# Patient Record
Sex: Female | Born: 1960 | Race: White | Hispanic: No | Marital: Married | Smoking: Former smoker
Health system: Southern US, Community
[De-identification: ages and names within clinical notes are randomized; demographics above are authoritative.]

## PROBLEM LIST (undated history)

## (undated) DIAGNOSIS — J302 Other seasonal allergic rhinitis: Secondary | ICD-10-CM

## (undated) DIAGNOSIS — E042 Nontoxic multinodular goiter: Secondary | ICD-10-CM

## (undated) DIAGNOSIS — I10 Essential (primary) hypertension: Secondary | ICD-10-CM

## (undated) DIAGNOSIS — E041 Nontoxic single thyroid nodule: Secondary | ICD-10-CM

## (undated) DIAGNOSIS — R011 Cardiac murmur, unspecified: Secondary | ICD-10-CM

## (undated) DIAGNOSIS — N390 Urinary tract infection, site not specified: Secondary | ICD-10-CM

## (undated) DIAGNOSIS — E559 Vitamin D deficiency, unspecified: Secondary | ICD-10-CM

## (undated) HISTORY — DX: Vitamin D deficiency, unspecified: E55.9

## (undated) HISTORY — PX: WISDOM TOOTH EXTRACTION: SHX21

## (undated) HISTORY — DX: Essential (primary) hypertension: I10

## (undated) HISTORY — DX: Nontoxic multinodular goiter: E04.2

## (undated) HISTORY — DX: Cardiac murmur, unspecified: R01.1

## (undated) HISTORY — PX: EYE SURGERY: SHX253

---

## 2003-12-28 ENCOUNTER — Other Ambulatory Visit: Admission: RE | Admit: 2003-12-28 | Discharge: 2003-12-28 | Payer: Self-pay | Admitting: Family Medicine

## 2005-10-02 ENCOUNTER — Other Ambulatory Visit: Admission: RE | Admit: 2005-10-02 | Discharge: 2005-10-02 | Payer: Self-pay | Admitting: Family Medicine

## 2007-07-22 ENCOUNTER — Other Ambulatory Visit: Admission: RE | Admit: 2007-07-22 | Discharge: 2007-07-22 | Payer: Self-pay | Admitting: Family Medicine

## 2009-04-26 ENCOUNTER — Other Ambulatory Visit: Admission: RE | Admit: 2009-04-26 | Discharge: 2009-04-26 | Payer: Self-pay | Admitting: Family Medicine

## 2010-09-19 ENCOUNTER — Other Ambulatory Visit
Admission: RE | Admit: 2010-09-19 | Discharge: 2010-09-19 | Payer: Self-pay | Source: Home / Self Care | Admitting: Family Medicine

## 2011-12-30 ENCOUNTER — Other Ambulatory Visit: Payer: Self-pay | Admitting: Family Medicine

## 2011-12-30 DIAGNOSIS — M542 Cervicalgia: Secondary | ICD-10-CM

## 2011-12-31 ENCOUNTER — Ambulatory Visit
Admission: RE | Admit: 2011-12-31 | Discharge: 2011-12-31 | Disposition: A | Discharge status: Home / Self Care | Payer: Managed Care, Other (non HMO) | Source: Ambulatory Visit | Attending: Family Medicine | Admitting: Family Medicine

## 2011-12-31 DIAGNOSIS — M542 Cervicalgia: Secondary | ICD-10-CM

## 2012-01-01 ENCOUNTER — Other Ambulatory Visit: Payer: Self-pay

## 2012-01-02 ENCOUNTER — Other Ambulatory Visit: Payer: Self-pay | Admitting: Family Medicine

## 2012-01-02 DIAGNOSIS — E041 Nontoxic single thyroid nodule: Secondary | ICD-10-CM

## 2012-01-03 ENCOUNTER — Ambulatory Visit
Admission: RE | Admit: 2012-01-03 | Discharge: 2012-01-03 | Disposition: A | Discharge status: Home / Self Care | Payer: Managed Care, Other (non HMO) | Source: Ambulatory Visit | Attending: Family Medicine | Admitting: Family Medicine

## 2012-01-03 DIAGNOSIS — E041 Nontoxic single thyroid nodule: Secondary | ICD-10-CM

## 2012-01-06 ENCOUNTER — Encounter (HOSPITAL_COMMUNITY): Payer: Self-pay | Admitting: Pharmacy Technician

## 2012-01-06 ENCOUNTER — Other Ambulatory Visit: Payer: Self-pay | Admitting: Neurological Surgery

## 2012-01-08 ENCOUNTER — Encounter (HOSPITAL_COMMUNITY): Payer: Self-pay

## 2012-01-08 ENCOUNTER — Ambulatory Visit (HOSPITAL_COMMUNITY)
Admission: RE | Admit: 2012-01-08 | Discharge: 2012-01-08 | Disposition: A | Discharge status: Home / Self Care | Payer: Managed Care, Other (non HMO) | Source: Ambulatory Visit | Attending: Neurological Surgery | Admitting: Neurological Surgery

## 2012-01-08 ENCOUNTER — Encounter (HOSPITAL_COMMUNITY)
Admission: RE | Admit: 2012-01-08 | Discharge: 2012-01-08 | Disposition: A | Discharge status: Home / Self Care | Payer: Managed Care, Other (non HMO) | Source: Ambulatory Visit | Attending: Neurological Surgery | Admitting: Neurological Surgery

## 2012-01-08 DIAGNOSIS — Z01818 Encounter for other preprocedural examination: Secondary | ICD-10-CM | POA: Insufficient documentation

## 2012-01-08 DIAGNOSIS — Z01810 Encounter for preprocedural cardiovascular examination: Secondary | ICD-10-CM | POA: Insufficient documentation

## 2012-01-08 DIAGNOSIS — Z01812 Encounter for preprocedural laboratory examination: Secondary | ICD-10-CM | POA: Insufficient documentation

## 2012-01-08 DIAGNOSIS — Z01811 Encounter for preprocedural respiratory examination: Secondary | ICD-10-CM | POA: Insufficient documentation

## 2012-01-08 HISTORY — DX: Other seasonal allergic rhinitis: J30.2

## 2012-01-08 HISTORY — DX: Urinary tract infection, site not specified: N39.0

## 2012-01-08 HISTORY — DX: Nontoxic single thyroid nodule: E04.1

## 2012-01-08 LAB — HCG, SERUM, QUALITATIVE: Preg, Serum: NEGATIVE

## 2012-01-08 LAB — BASIC METABOLIC PANEL
GFR calc Af Amer: >90 mL/min (ref >90)
GFR calc non Af Amer: >90 mL/min (ref >90)
Potassium: 3.5 mEq/L (ref 3.5–5.1)
Sodium: 135 mEq/L (ref 135–145)

## 2012-01-08 LAB — SURGICAL PCR SCREEN
MRSA, PCR: NEGATIVE
Staphylococcus aureus: POSITIVE — AB

## 2012-01-08 LAB — CBC
Hemoglobin: 13.7 g/dL (ref 12.0–15.0)
RBC: 4.56 MIL/uL (ref 3.87–5.11)

## 2012-01-08 LAB — PROTIME-INR
INR: 1.04 (ref 0.00–1.49)
Prothrombin Time: 13.8 seconds (ref 11.6–15.2)

## 2012-01-08 MED ORDER — DEXAMETHASONE SODIUM PHOSPHATE 10 MG/ML IJ SOLN
10.0000 mg | INTRAMUSCULAR | Status: AC
  Administered 2012-01-09: 10 mg via INTRAVENOUS
  Filled 2012-01-08: qty 1

## 2012-01-08 MED ORDER — CEFAZOLIN SODIUM 1-5 GM-% IV SOLN
1.0000 g | INTRAVENOUS | Status: AC
  Administered 2012-01-09: 1 g via INTRAVENOUS
  Filled 2012-01-08: qty 50

## 2012-01-08 NOTE — Progress Notes (Signed)
Contacted Dr. Michelle Piper, notified of patients abnormal lab, BS-[id number].  Dr. Michelle Piper request BS be checked in am prior to surgery.

## 2012-01-08 NOTE — Pre-Procedure Instructions (Signed)
20 Betty Kelly  01/08/2012   Your procedure is scheduled on:  Thursday Jan 09, 2012  Report to [facility] at 9:15AM.  Call this number if you have problems the morning of surgery: [phone]   Remember:   Do not eat food:After Midnight.  May have clear liquids: up to 4 Hours before arrival. ( up to 5:15am)  Clear liquids include soda, tea, black coffee, apple or grape juice, broth.  Take these medicines the morning of surgery with A SIP OF WATER: zyrtec, hydrocodone,    Do not wear jewelry, make-up or nail polish.  Do not wear lotions, powders, or perfumes. You may wear deodorant.  Do not shave 48 hours prior to surgery.  Do not bring valuables to the hospital.  Contacts, dentures or bridgework may not be worn into surgery.  Leave suitcase in the car. After surgery it may be brought to your room.  For patients admitted to the hospital, checkout time is 11:00 AM the day of discharge.   Patients discharged the day of surgery will not be allowed to drive home.  Name and phone number of your driver: Betty Kelly [phone]  Special Instructions: CHG Shower Use Special Wash: 1/2 bottle night before surgery and 1/2 bottle morning of surgery.   Please read over the following fact sheets that you were given: Pain Booklet, Coughing and Deep Breathing, MRSA Information and Surgical Site Infection Prevention

## 2012-01-09 ENCOUNTER — Ambulatory Visit (HOSPITAL_COMMUNITY): Payer: Managed Care, Other (non HMO)

## 2012-01-09 ENCOUNTER — Encounter (HOSPITAL_COMMUNITY): Payer: Self-pay | Admitting: Certified Registered"

## 2012-01-09 ENCOUNTER — Encounter (HOSPITAL_COMMUNITY): Payer: Self-pay | Admitting: *Deleted

## 2012-01-09 ENCOUNTER — Ambulatory Visit (HOSPITAL_COMMUNITY)
Admission: RE | Admit: 2012-01-09 | Discharge: 2012-01-09 | Disposition: A | Discharge status: Home / Self Care | Payer: Managed Care, Other (non HMO) | Source: Ambulatory Visit | Attending: Neurological Surgery | Admitting: Neurological Surgery

## 2012-01-09 ENCOUNTER — Ambulatory Visit (HOSPITAL_COMMUNITY): Payer: Managed Care, Other (non HMO) | Admitting: Certified Registered"

## 2012-01-09 ENCOUNTER — Encounter (HOSPITAL_COMMUNITY)
Admission: RE | Disposition: A | Discharge status: Home / Self Care | Payer: Self-pay | Source: Ambulatory Visit | Attending: Neurological Surgery

## 2012-01-09 DIAGNOSIS — M5020 Other cervical disc displacement, unspecified cervical region: Secondary | ICD-10-CM | POA: Insufficient documentation

## 2012-01-09 DIAGNOSIS — Z01812 Encounter for preprocedural laboratory examination: Secondary | ICD-10-CM | POA: Insufficient documentation

## 2012-01-09 DIAGNOSIS — Z87891 Personal history of nicotine dependence: Secondary | ICD-10-CM | POA: Insufficient documentation

## 2012-01-09 DIAGNOSIS — Z01810 Encounter for preprocedural cardiovascular examination: Secondary | ICD-10-CM | POA: Insufficient documentation

## 2012-01-09 DIAGNOSIS — Z01818 Encounter for other preprocedural examination: Secondary | ICD-10-CM | POA: Insufficient documentation

## 2012-01-09 HISTORY — PX: ANTERIOR CERVICAL DECOMP/DISCECTOMY FUSION: SHX1161

## 2012-01-09 SURGERY — ANTERIOR CERVICAL DECOMPRESSION/DISCECTOMY FUSION 1 LEVEL
Anesthesia: General | Site: Neck | Wound class: Clean

## 2012-01-09 MED ORDER — DEXAMETHASONE 4 MG PO TABS
4.0000 mg | ORAL_TABLET | Freq: Four times a day (QID) | ORAL | Status: DC
Start: 2012-01-09 — End: 2012-01-09
  Administered 2012-01-09: 4 mg via ORAL
  Filled 2012-01-09: qty 1

## 2012-01-09 MED ORDER — VECURONIUM BROMIDE 10 MG IV SOLR
INTRAVENOUS | Status: DC | PRN
  Administered 2012-01-09: 7 mg via INTRAVENOUS

## 2012-01-09 MED ORDER — HYDROMORPHONE HCL PF 1 MG/ML IJ SOLN
0.5000 mg | INTRAMUSCULAR | Status: DC | PRN
  Administered 2012-01-09: 0.5 mg via INTRAVENOUS

## 2012-01-09 MED ORDER — ZOLPIDEM TARTRATE 5 MG PO TABS: 10.0000 mg | ORAL_TABLET | Freq: Every evening | ORAL | Status: DC | PRN

## 2012-01-09 MED ORDER — THROMBIN 5000 UNITS EX SOLR
OROMUCOSAL | Status: DC | PRN
  Administered 2012-01-09: 12:00:00 via TOPICAL

## 2012-01-09 MED ORDER — SUFENTANIL CITRATE 50 MCG/ML IV SOLN
INTRAVENOUS | Status: DC | PRN
  Administered 2012-01-09: 20 ug via INTRAVENOUS
  Administered 2012-01-09: 5 ug via INTRAVENOUS

## 2012-01-09 MED ORDER — HYDROMORPHONE HCL PF 1 MG/ML IJ SOLN
INTRAMUSCULAR | Status: AC
  Filled 2012-01-09: qty 1

## 2012-01-09 MED ORDER — MUPIROCIN 2 % EX OINT
TOPICAL_OINTMENT | CUTANEOUS | Status: AC
  Filled 2012-01-09: qty 22

## 2012-01-09 MED ORDER — ONDANSETRON HCL 4 MG/2ML IJ SOLN
INTRAMUSCULAR | Status: DC | PRN
  Administered 2012-01-09: 4 mg via INTRAVENOUS

## 2012-01-09 MED ORDER — BACITRACIN 50000 UNITS IM SOLR
INTRAMUSCULAR | Status: AC
  Filled 2012-01-09: qty 1

## 2012-01-09 MED ORDER — DROPERIDOL 2.5 MG/ML IJ SOLN: 0.6250 mg | INTRAMUSCULAR | Status: DC | PRN

## 2012-01-09 MED ORDER — SODIUM CHLORIDE 0.9 % IV SOLN: 250.0000 mL | INTRAVENOUS | Status: DC

## 2012-01-09 MED ORDER — ACETAMINOPHEN 650 MG RE SUPP: 650.0000 mg | RECTAL | Status: DC | PRN

## 2012-01-09 MED ORDER — 0.9 % SODIUM CHLORIDE (POUR BTL) OPTIME
TOPICAL | Status: DC | PRN
  Administered 2012-01-09: 1000 mL

## 2012-01-09 MED ORDER — LIDOCAINE HCL 4 % MT SOLN
OROMUCOSAL | Status: DC | PRN
  Administered 2012-01-09: 4 mL via TOPICAL

## 2012-01-09 MED ORDER — PROPOFOL 10 MG/ML IV EMUL
INTRAVENOUS | Status: DC | PRN
  Administered 2012-01-09: 180 mg via INTRAVENOUS

## 2012-01-09 MED ORDER — MENTHOL 3 MG MT LOZG: 1.0000 | LOZENGE | OROMUCOSAL | Status: DC | PRN

## 2012-01-09 MED ORDER — DEXAMETHASONE SODIUM PHOSPHATE 4 MG/ML IJ SOLN: 4.0000 mg | Freq: Four times a day (QID) | INTRAMUSCULAR | Status: DC

## 2012-01-09 MED ORDER — CYCLOBENZAPRINE HCL 10 MG PO TABS: 10.0000 mg | ORAL_TABLET | Freq: Three times a day (TID) | ORAL | Status: DC | PRN

## 2012-01-09 MED ORDER — HYDROCODONE-ACETAMINOPHEN 5-325 MG PO TABS
1.0000 | ORAL_TABLET | ORAL | Status: DC | PRN
Start: 2012-01-09 — End: 2012-01-09
  Administered 2012-01-09: 1 via ORAL
  Filled 2012-01-09: qty 1

## 2012-01-09 MED ORDER — SODIUM CHLORIDE 0.9 % IJ SOLN: 3.0000 mL | INTRAMUSCULAR | Status: DC | PRN

## 2012-01-09 MED ORDER — POTASSIUM CHLORIDE IN NACL 20-0.9 MEQ/L-% IV SOLN
INTRAVENOUS | Status: DC
  Filled 2012-01-09 (×2): qty 1000

## 2012-01-09 MED ORDER — MIDAZOLAM HCL 5 MG/5ML IJ SOLN
INTRAMUSCULAR | Status: DC | PRN
  Administered 2012-01-09: 2 mg via INTRAVENOUS

## 2012-01-09 MED ORDER — THROMBIN 5000 UNITS EX KIT
PACK | CUTANEOUS | Status: DC | PRN
  Administered 2012-01-09 (×2): 5000 [IU] via TOPICAL

## 2012-01-09 MED ORDER — ONDANSETRON HCL 4 MG/2ML IJ SOLN: 4.0000 mg | INTRAMUSCULAR | Status: DC | PRN

## 2012-01-09 MED ORDER — PHENOL 1.4 % MT LIQD: 1.0000 | OROMUCOSAL | Status: DC | PRN

## 2012-01-09 MED ORDER — CEFAZOLIN SODIUM 1-5 GM-% IV SOLN
1.0000 g | Freq: Three times a day (TID) | INTRAVENOUS | Status: DC
  Filled 2012-01-09 (×2): qty 50

## 2012-01-09 MED ORDER — SODIUM CHLORIDE 0.9 % IV SOLN
INTRAVENOUS | Status: AC
  Filled 2012-01-09: qty 500

## 2012-01-09 MED ORDER — LACTATED RINGERS IV SOLN
INTRAVENOUS | Status: DC | PRN
  Administered 2012-01-09 (×2): via INTRAVENOUS

## 2012-01-09 MED ORDER — HYDROMORPHONE HCL PF 1 MG/ML IJ SOLN
0.2500 mg | INTRAMUSCULAR | Status: DC | PRN
  Administered 2012-01-09 (×2): 0.5 mg via INTRAVENOUS

## 2012-01-09 MED ORDER — HYDROMORPHONE HCL PF 1 MG/ML IJ SOLN: 0.2500 mg | INTRAMUSCULAR | Status: DC | PRN

## 2012-01-09 MED ORDER — ACETAMINOPHEN 325 MG PO TABS: 650.0000 mg | ORAL_TABLET | ORAL | Status: DC | PRN

## 2012-01-09 MED ORDER — HEMOSTATIC AGENTS (NO CHARGE) OPTIME
TOPICAL | Status: DC | PRN
  Administered 2012-01-09: 1 via TOPICAL

## 2012-01-09 MED ORDER — SODIUM CHLORIDE 0.9 % IJ SOLN: 3.0000 mL | Freq: Two times a day (BID) | INTRAMUSCULAR | Status: DC

## 2012-01-09 MED ORDER — SODIUM CHLORIDE 0.9 % IR SOLN
Status: DC | PRN
  Administered 2012-01-09: 12:00:00

## 2012-01-09 MED ORDER — SENNA 8.6 MG PO TABS
1.0000 | ORAL_TABLET | Freq: Two times a day (BID) | ORAL | Status: DC
  Administered 2012-01-09: 8.6 mg via ORAL
  Filled 2012-01-09: qty 1

## 2012-01-09 MED ORDER — BUPIVACAINE HCL (PF) 0.25 % IJ SOLN
INTRAMUSCULAR | Status: DC | PRN
  Administered 2012-01-09: 4 mL

## 2012-01-09 SURGICAL SUPPLY — 54 items
BAG DECANTER FOR FLEXI CONT (MISCELLANEOUS) ×2 IMPLANT
BENZOIN TINCTURE PRP APPL 2/3 (GAUZE/BANDAGES/DRESSINGS) ×2 IMPLANT
BUR MATCHSTICK NEURO 3.0 LAGG (BURR) ×2 IMPLANT
CAGE SMALL 7X13X15 (Cage) ×2 IMPLANT
CANISTER SUCTION 2500CC (MISCELLANEOUS) ×2 IMPLANT
CLOTH BEACON ORANGE TIMEOUT ST (SAFETY) ×2 IMPLANT
CONT SPEC 4OZ CLIKSEAL STRL BL (MISCELLANEOUS) ×2 IMPLANT
DRAPE C-ARM 42X72 X-RAY (DRAPES) ×4 IMPLANT
DRAPE LAPAROTOMY 100X72 PEDS (DRAPES) ×2 IMPLANT
DRAPE MICROSCOPE LEICA (MISCELLANEOUS) ×2 IMPLANT
DRAPE MICROSCOPE ZEISS OPMI (DRAPES) IMPLANT
DRAPE POUCH INSTRU U-SHP 10X18 (DRAPES) ×2 IMPLANT
DRESSING TELFA 8X3 (GAUZE/BANDAGES/DRESSINGS) ×2 IMPLANT
DRILL BIT 12MM (BIT) ×2 IMPLANT
DRSG OPSITE 4X5.5 SM (GAUZE/BANDAGES/DRESSINGS) ×2 IMPLANT
DURAPREP 6ML APPLICATOR 50/CS (WOUND CARE) ×2 IMPLANT
ELECT COATED BLADE 2.86 ST (ELECTRODE) ×2 IMPLANT
ELECT REM PT RETURN 9FT ADLT (ELECTROSURGICAL) ×2
ELECTRODE REM PT RTRN 9FT ADLT (ELECTROSURGICAL) ×1 IMPLANT
GAUZE SPONGE 4X4 16PLY XRAY LF (GAUZE/BANDAGES/DRESSINGS) IMPLANT
GLOVE BIO SURGEON STRL SZ8 (GLOVE) ×2 IMPLANT
GLOVE BIOGEL PI IND STRL 7.0 (GLOVE) ×1 IMPLANT
GLOVE BIOGEL PI IND STRL 7.5 (GLOVE) ×1 IMPLANT
GLOVE BIOGEL PI IND STRL 8 (GLOVE) ×1 IMPLANT
GLOVE BIOGEL PI INDICATOR 7.0 (GLOVE) ×1
GLOVE BIOGEL PI INDICATOR 7.5 (GLOVE) ×1
GLOVE BIOGEL PI INDICATOR 8 (GLOVE) ×1
GLOVE ECLIPSE 7.5 STRL STRAW (GLOVE) ×8 IMPLANT
GLOVE SURG SS PI 7.0 STRL IVOR (GLOVE) ×4 IMPLANT
GOWN BRE IMP SLV AUR LG STRL (GOWN DISPOSABLE) ×2 IMPLANT
GOWN BRE IMP SLV AUR XL STRL (GOWN DISPOSABLE) ×6 IMPLANT
GOWN STRL REIN 2XL LVL4 (GOWN DISPOSABLE) ×2 IMPLANT
HEMOSTAT POWDER KIT SURGIFOAM (HEMOSTASIS) ×2 IMPLANT
KIT BASIN OR (CUSTOM PROCEDURE TRAY) ×2 IMPLANT
KIT ROOM TURNOVER OR (KITS) ×2 IMPLANT
NEEDLE HYPO 25X1 1.5 SAFETY (NEEDLE) ×2 IMPLANT
NEEDLE SPNL 20GX3.5 QUINCKE YW (NEEDLE) ×2 IMPLANT
NS IRRIG 1000ML POUR BTL (IV SOLUTION) ×2 IMPLANT
PACK LAMINECTOMY NEURO (CUSTOM PROCEDURE TRAY) ×2 IMPLANT
PAD ARMBOARD 7.5X6 YLW CONV (MISCELLANEOUS) ×2 IMPLANT
PLATE RELIANT LEVEL 1 22MM (Plate) ×2 IMPLANT
PUTTY ABX ACTIFUSE 1.5ML (Putty) ×2 IMPLANT
RUBBERBAND STERILE (MISCELLANEOUS) ×4 IMPLANT
SCREW PRIMARY 4.4MM 12MM (Screw) ×8 IMPLANT
SPONGE GAUZE 4X4 12PLY (GAUZE/BANDAGES/DRESSINGS) ×2 IMPLANT
SPONGE INTESTINAL PEANUT (DISPOSABLE) ×2 IMPLANT
SPONGE SURGIFOAM ABS GEL SZ50 (HEMOSTASIS) ×2 IMPLANT
STRIP CLOSURE SKIN 1/2X4 (GAUZE/BANDAGES/DRESSINGS) ×2 IMPLANT
SUT VIC AB 3-0 SH 8-18 (SUTURE) ×2 IMPLANT
SYR 20ML ECCENTRIC (SYRINGE) ×2 IMPLANT
TOWEL OR 17X24 6PK STRL BLUE (TOWEL DISPOSABLE) ×2 IMPLANT
TOWEL OR 17X26 10 PK STRL BLUE (TOWEL DISPOSABLE) ×2 IMPLANT
TRAP SPECIMEN MUCOUS 40CC (MISCELLANEOUS) ×2 IMPLANT
WATER STERILE IRR 1000ML POUR (IV SOLUTION) ×2 IMPLANT

## 2012-01-09 NOTE — Transfer of Care (Signed)
Immediate Anesthesia Transfer of Care Note  Patient: Betty Kelly  Procedure(s) Performed: Procedure(s) (LRB): ANTERIOR CERVICAL DECOMPRESSION/DISCECTOMY FUSION 1 LEVEL (N/A)  Patient Location: PACU  Anesthesia Type: General  Level of Consciousness: awake, alert  and oriented  Airway & Oxygen Therapy: Patient Spontanous Breathing and Patient connected to nasal cannula oxygen  Post-op Assessment: Report given to PACU RN, Post -op Vital signs reviewed and stable and Patient moving all extremities  Post vital signs: Reviewed and stable  Complications: No apparent anesthesia complications

## 2012-01-09 NOTE — H&P (Signed)
Subjective:   Patient is a 51 y.o. female admitted for ACDF 6-7. The patient first presented to me with complaints of neck pain, shooting pains in the arm(s) and numbness of the arm(s). Onset of symptoms was 4 weeks ago. The pain is described as aching and sharp and occurs all day. The pain is rated moderate, and is located at the neck and radiates to the RUE. The symptoms have been progressive. Symptoms are exacerbated by extending head backwards, and are relieved by medications.  Previous work up includes MRI of cervical spine, results: disc bulge at C6-C7 right.  Past Medical History  Diagnosis Date  . Thyroid nodule     " multiple"  . Urinary tract infection     hx of  . Seasonal allergies     Past Surgical History  Procedure Date  . Eye surgery     "correction of lazy eye bilaterally"  . Wisdom tooth extraction     No Known Allergies  History  Substance Use Topics  . Smoking status: Former Games developer  . Smokeless tobacco: Former Neurosurgeon    Quit date: 06/15/2010  . Alcohol Use: Yes     "occassional"    Family History  Problem Relation Age of Onset  . Anesthesia problems Neg Hx    Prior to Admission medications   Medication Sig Start Date End Date Taking? Authorizing Provider  cetirizine (ZYRTEC) 10 MG tablet Take 10 mg by mouth daily.   Yes Historical Provider, MD  cyclobenzaprine (FLEXERIL) 10 MG tablet Take 10 mg by mouth 3 (three) times daily as needed. For pain   Yes Historical Provider, MD  HYDROcodone-acetaminophen (NORCO) 5-325 MG per tablet Take 1 tablet by mouth every 6 (six) hours as needed. For pain   Yes Historical Provider, MD  ibuprofen (ADVIL,MOTRIN) 800 MG tablet Take 800 mg by mouth every 8 (eight) hours as needed. For pain    Historical Provider, MD     Review of Systems  Positive ROS: neg  All other systems have been reviewed and were otherwise negative with the exception of those mentioned in the HPI and as above.  Objective: Vital signs in last 24  hours: Temp:  [97.1 F (36.2 C)-99.1 F (37.3 C)] 99.1 F (37.3 C) (05/02 0920) Pulse Rate:  [106-109] 106  (05/02 0920) Resp:  [18-20] 18  (05/02 0920) BP: (125-143)/(70-80) 143/80 mmHg (05/02 0920) SpO2:  [95 %-100 %] 100 % (05/02 0920) Weight:  [74.2 kg (163 lb 9.3 oz)] 74.2 kg (163 lb 9.3 oz) (05/01 1245)  General Appearance: Alert, cooperative, no distress, appears stated age Head: Normocephalic, without obvious abnormality, atraumatic Eyes: PERRL, conjunctiva/corneas clear, EOM's intact, fundi benign, both eyes      Ears: Normal TM's and external ear canals, both ears Throat: Lips, mucosa, and tongue normal; teeth and gums normal Neck: Supple, symmetrical, trachea midline, no adenopathy; thyroid: No enlargement/tenderness/nodules; no carotid bruit or JVD Back: Symmetric, no curvature, ROM normal, no CVA tenderness Lungs: Clear to auscultation bilaterally, respirations unlabored Heart: Regular rate and rhythm, S1 and S2 normal, no murmur, rub or gallop Abdomen: Soft, non-tender, bowel sounds active all four quadrants, no masses, no organomegaly Extremities: Extremities normal, atraumatic, no cyanosis or edema Pulses: 2+ and symmetric all extremities Skin: Skin color, texture, turgor normal, no rashes or lesions  NEUROLOGIC:  Mental status: Alert and oriented x4, no aphasia, good attention span, fund of knowledge and memory  Motor Exam - grossly normal except mild weakness R tricep Sensory Exam -  grossly normal Reflexes: 1+ Coordination - grossly normal Gait - grossly normal Balance - grossly normal Cranial Nerves: I: smell Not tested  II: visual acuity  OS: nl    OD: nl  II: visual fields Full to confrontation  II: pupils Equal, round, reactive to light  III,VII: ptosis None  III,IV,VI: extraocular muscles  Full ROM  V: mastication Normal  V: facial light touch sensation  Normal  V,VII: corneal reflex  Present  VII: facial muscle function - upper  Normal  VII: facial  muscle function - lower Normal  VIII: hearing Not tested  IX: soft palate elevation  Normal  IX,X: gag reflex Present  XI: trapezius strength  5/5  XI: sternocleidomastoid strength 5/5  XI: neck flexion strength  5/5  XII: tongue strength  Normal    Data Review Lab Results  Component Value Date   WBC 9.1 01/08/2012   HGB 13.7 01/08/2012   HCT 39.4 01/08/2012   MCV 86.4 01/08/2012   PLT 316 01/08/2012   Lab Results  Component Value Date   NA 135 01/08/2012   K 3.5 01/08/2012   CL 101 01/08/2012   CO2 26 01/08/2012   BUN 12 01/08/2012   CREATININE 0.69 01/08/2012   GLUCOSE 64* 01/08/2012   Lab Results  Component Value Date   INR 1.04 01/08/2012    Assessment:   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 Patient has failed conservative therapy. Planned surgery : ACDF C6-7  Plan:   I explained the condition and procedure to the patient and answered any questions.  Patient wishes to proceed with procedure as planned. Understands risks/ benefits/ and expected or typical outcomes.  Betty Kelly S 01/09/2012 9:59 AM

## 2012-01-09 NOTE — Op Note (Signed)
01/09/2012  12:19 PM  PATIENT:  Betty Kelly  51 y.o. 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 Decompressive anterior cervical discectomy C6-7, 2. Anterior cervical arthrodesis C6-7 utilizing a 7 mm peek interbody cage packed with local autograft and morcellized allograft, 3. Anterior cervical plating C6-7 utilizing a Orthofix plate  SURGEON:  Marikay Alar, MD  ASSISTANTS: Dr. Gerlene Fee  ANESTHESIA:   General  EBL: 25 ml  Total I/O In: 1000 [I.V.:1000] Out: -   BLOOD ADMINISTERED:none  DRAINS: None   SPECIMEN:  No Specimen  INDICATION FOR PROCEDURE: This patient presented with severe right arm pain with numbness and tingling in a C7 distribution with weakness in her triceps. MRI confirmed a very large disc herniation at C6-7 on the right. I recommended ACDF with plating at C6-7. Patient understood the risks, benefits, and alternatives and potential outcomes and wished to proceed.  PROCEDURE DETAILS: Patient was brought to the operating room placed under general endotracheal anesthesia. Patient was placed in the supine position on the operating room table. The neck was prepped with Duraprep and draped in a sterile fashion.   Three cc of local anesthesia was injected and a transverse incision was made on the right side of the neck.  Dissection was carried down thru the subcutaneous tissue and the platysma was  elevated, opened, and undermined with Metzenbaum scissors.  Dissection was then carried out thru an avascular plane leaving the sternocleidomastoid carotid artery and jugular vein laterally and the trachea and esophagus medially. The ventral aspect of the vertebral column was identified and a localizing x-ray was taken. The C6-7 level was identified. The longus colli muscles were then elevated and the retractor was placed. The annulus was incised and the disc space  entered. Discectomy was performed with micro-curettes and pituitary rongeurs. I then used the high-speed drill to drill the endplates down to the level of the posterior longitudinal ligament. The drill shavings were saved in a mucous trap for later arthrodesis. The operating microscope was draped and brought into the field provided additional magnification, illumination and visualization. Discectomy was continued posteriorly thru the disc space. A very large disc herniation was found at C6-7 on the right and several large free fragments were removed with I nerve hook and a pituitary rongeur. Posterior longitudinal ligament was opened with a nerve hook, and then removed along with disc herniation and osteophytes, decompressing the spinal canal and thecal sac. We then continued to remove osteophytic overgrowth and disc material decompressing the neural foramina and exiting nerve roots bilaterally but paid particular attention to C6-7 on the right. The scope was angled up and down to help decompress and undercut the vertebral bodies. Once the decompression was completed we could pass a nerve hook circumferentially to assure adequate decompression in the midline and in the neural foramina. So by both visualization and palpation we felt we had an adequate decompression of the neural elements. We then measured the height of the intravertebral disc space and selected a  7 millimeter Peek interbody cage packed with autograft and morcellized allograft. It was then gently positioned in the intravertebral disc space and countersunk. I then used a  Orthofix plate and placed four variable angle screws into the vertebral bodies and locked them into position. The wound was irrigated with bacitracin solution, checked for hemostasis which was established and confirmed. Once meticulous hemostasis was achieved, we then proceeded with  closure. The platysma was closed with interrupted 3-0 undyed Vicryl suture, the subcuticular layer was  closed with interrupted 3-0 undyed Vicryl suture. The skin edges were approximated with steristrips. The drapes were removed. A sterile dressing was applied. The patient was then awakened from general anesthesia and transferred to the recovery room in stable condition. At the end of the procedure all sponge, needle and instrument counts were correct.   PLAN OF CARE: Admit for overnight observation  PATIENT DISPOSITION:  PACU - hemodynamically stable.   Delay start of Pharmacological VTE agent (>24hrs) due to surgical blood loss or risk of bleeding:  yes

## 2012-01-09 NOTE — Preoperative (Signed)
Beta Blockers   Reason not to administer Beta Blockers:Not Applicable 

## 2012-01-09 NOTE — Progress Notes (Signed)
Pt D/C'd home via wheelchair @ 1830 per MD order. Pt given D/C instructions with Rx. Rema Fendt, RN

## 2012-01-09 NOTE — Discharge Instructions (Signed)
Patient Discharge   KAYLIAH TINDOL / [id number] DOB: Sep 14, 1960   Admitted 01/09/2012 Discharged: 01/09/2012    Scheduled Meds:   . bacitracin      .  ceFAZolin (ANCEF) IV  1 g Intravenous 60 min Pre-Op  .  ceFAZolin (ANCEF) IV  1 g Intravenous Q8H  . dexamethasone  10 mg Intravenous To OR  . dexamethasone  4 mg Intravenous Q6H   Or  . dexamethasone  4 mg Oral Q6H  . HYDROmorphone      . HYDROmorphone      . mupirocin ointment      . senna  1 tablet Oral BID  . sodium chloride      . sodium chloride  3 mL Intravenous Q12H   Continuous Infusions:   . sodium chloride    . 0.9 % NaCl with KCl 20 mEq / L     PRN Meds:acetaminophen, acetaminophen, cyclobenzaprine, HYDROcodone-acetaminophen, HYDROmorphone (DILAUDID) injection, menthol-cetylpyridinium, ondansetron (ZOFRAN) IV, phenol, sodium chloride, zolpidem, DISCONTD: 0.9 % irrigation (POUR BTL), DISCONTD: bacitracin irrigation, DISCONTD: bupivacaine, DISCONTD: droperidol, DISCONTD: droperidol, DISCONTD: hemostatic agents, DISCONTD: HYDROmorphone, DISCONTD:  HYDROmorphone (DILAUDID) injection DISCONTD: Surgifoam 1 Gm with Thrombin 5,000 units (5 ml) topical solution, DISCONTD: thrombin  Personal Items   Please collect all clothing which belongs to you from your nurse. Please collect any valuables you stored during your stay from the front desk, and please remember all of your personal items, such as dentures, canes, and eyeglasses.   Activity Instructions   You must avoid lifting more than *** pounds until your physician instructs you differently. You should avoid {d/c avoid/resume:120111}. You may resume {d/c avoid/resume:120111}.   I understand that if any problems occur once I am at home I am to contact my physician.  I understand and acknowledge receipt of the instructions indicated above.    _____________________________________________                                                       Physician's or R.N.'s Signature                 Date/Time                        _____________________________________________                                                       Patient or Representative Signature         Date/Time

## 2012-01-09 NOTE — Anesthesia Postprocedure Evaluation (Signed)
Anesthesia Post Note  Patient: Betty Kelly  Procedure(s) Performed: Procedure(s) (LRB): ANTERIOR CERVICAL DECOMPRESSION/DISCECTOMY FUSION 1 LEVEL (N/A)  Anesthesia type: general  Patient location: PACU  Post pain: Pain level controlled  Post assessment: Patient's Cardiovascular Status Stable  Last Vitals:  Filed Vitals:   01/09/12 1350  BP: 153/91  Pulse: 101  Temp: 36.4 C  Resp: 16    Post vital signs: Reviewed and stable  Level of consciousness: sedated  Complications: No apparent anesthesia complications

## 2012-01-09 NOTE — Anesthesia Preprocedure Evaluation (Signed)
Anesthesia Evaluation  Patient identified by MRN, date of birth, ID band Patient awake    Reviewed: Allergy & Precautions, H&P , NPO status , Patient's Chart, lab work & pertinent test results  History of Anesthesia Complications Negative for: history of anesthetic complications  Airway Mallampati: II TM Distance: >3 FB Neck ROM: Full    Dental  (+) Implants and Dental Advisory Given   Pulmonary neg pulmonary ROS, former smoker breath sounds clear to auscultation  Pulmonary exam normal       Cardiovascular negative cardio ROS  Rhythm:Regular Rate:Normal     Neuro/Psych negative psych ROS   GI/Hepatic negative GI ROS, Neg liver ROS,   Endo/Other  negative endocrine ROS  Renal/GU negative Renal ROS     Musculoskeletal   Abdominal   Peds  Hematology negative hematology ROS (+)   Anesthesia Other Findings   Reproductive/Obstetrics                           Anesthesia Physical Anesthesia Plan  ASA: II  Anesthesia Plan: General   Post-op Pain Management:    Induction: Intravenous  Airway Management Planned: Oral ETT  Additional Equipment:   Intra-op Plan:   Post-operative Plan:   Informed Consent: I have reviewed the patients History and Physical, chart, labs and discussed the procedure including the risks, benefits and alternatives for the proposed anesthesia with the patient or authorized representative who has indicated his/her understanding and acceptance.   Dental advisory given  Plan Discussed with: CRNA, Anesthesiologist and Surgeon  Anesthesia Plan Comments:         Anesthesia Quick Evaluation

## 2012-01-10 NOTE — Discharge Summary (Signed)
Physician Discharge Summary  Patient ID: Betty Kelly MRN: [id number] DOB/AGE: [birth year] 51 y.o.  Admit date: 01/09/2012 Discharge date: 01/10/2012  Admission Diagnoses: HNP C6-7    Discharge Diagnoses: same   Discharged Condition: good  Hospital Course: The patient was admitted on 01/09/2012 and taken to the operating room where the patient underwent ACDF C6-7. The patient tolerated the procedure well and was taken to the recovery room and then to the floor in stable condition. The hospital course was routine. There were no complications. The wound remained clean dry and intact. Pt had appropriate neck soreness. No complaints of arm pain or new N/T/W. The patient remained afebrile with stable vital signs, and tolerated a regular diet. The patient continued to increase activities, and pain was well controlled with oral pain medications.   Consults: None  Significant Diagnostic Studies:  Results for orders placed during the hospital encounter of 01/09/12  GLUCOSE, CAPILLARY      Component Value Range   Glucose-Capillary 88  70 - 99 (mg/dL)    Dg Chest 2 View  4/0/9811  *RADIOLOGY REPORT*  Clinical Data: Preop for cervical spine surgery, former smoking history  CHEST - 2 VIEW  Comparison: None.  Findings: The lungs are clear.  Mediastinal contours appear normal. The heart is within normal limits in size.  No bony abnormality is seen.  IMPRESSION: No active lung disease.  Original Report Authenticated By: Juline Patch, M.D.   Dg Cervical Spine 2-3 Views  01/09/2012  *RADIOLOGY REPORT*  Clinical Data: Cervical fusion.  C-ARM 1-60 MIN,CERVICAL SPINE - 2-3 VIEW  Comparison: Cervical spine MRI 12/31/2011.  Findings: Lateral spot film demonstrates a spinal needle marking the C6-7 level.  The second film demonstrates anterior plate and screws and interbody bone spacer fusing C6-7.  Normal alignment. No complicating features.  IMPRESSION: C6-7 fusion.  Original Report Authenticated By: P. Loralie Champagne, M.D.   Mr Cervical Spine Wo Contrast  01/01/2012  *RADIOLOGY REPORT*  Clinical Data: Right side neck, shoulder arm pain for 2 weeks.  MRI CERVICAL SPINE WITHOUT CONTRAST  Technique:  Multiplanar and multiecho pulse sequences of the cervical spine, to include the craniocervical junction and cervicothoracic junction, were obtained according to standard protocol without intravenous contrast.  Comparison: None.  Findings: Vertebral body height is maintained.  There is mild kyphosis centered about the C6-7 level.  No worrisome marrow lesion is identified.  The craniocervical junction is normal.  Cervical cord signal is unremarkable.  Visualized paraspinous structures demonstrate an enlarged thyroid with multiple lesions present measuring up to 2.3 cm in diameter on the right.  C2-3:  Negative.  C3-4:  Negative.  C4-5:  Negative.  C5-6:  Small right paracentral protrusion contacts the cord but the central canal and foramina appear open.  C6-7:  The patient has a very large right paracentral disc protrusion extending into the medial aspect of the right foramen. There is marked deformity of the cord and encroachment on the right C7 root.  Left foramen is open.  C7-T1:  Negative.  IMPRESSION:  1.  Large right-sided disc protrusion at C6-7 deforms the cord and encroaches on the right C7 root. 2.  Small central protrusion at C5-6 just contacts the cord without causing central canal or foraminal stenosis. 3.  Heterogeneous thyroid gland with lesions measuring up to 2.3 cm in diameter identified.  Thyroid ultrasound recommended for further evaluation.  Original Report Authenticated By: Bernadene Bell. Maricela Curet, M.D.   US Soft Tissue Head/neck  01/03/2012  *  RADIOLOGY REPORT*  Clinical Data: 51 year old female with thyroid nodule detected on the MRI.  THYROID ULTRASOUND  Technique: Ultrasound examination of the thyroid gland and adjacent soft tissues was performed.  Comparison:  Cervical spine MRI 12/31/2011.   Findings:  Right thyroid lobe:  7.5 x 2.7 x 4.4 cm. Left thyroid lobe:  5.0 x 2.0 x 2.6 cm. Isthmus:  3 mm.  Focal nodules: Right:  Large solid and cystic central nodule is complex measuring 4.7 x 3.2 x 2.5 cm.  A smaller adjacent nearly isoechoic nodule without hypervascularity measures up to 15 mm.  A nearly isoechoic solid nodule in the right lower pole with vascularity measures 1.3 x 1.1 x 1.5 cm. Isthmus:  None: Left:  Solid and cystic complex nodule in the left lower pole measures 3.0 x 1.9 x 1.8 cm.  Multiple additional solid nodules which are nearly isoechoic and show vascularity otherwise measure up to 2.0 cm.  Lymphadenopathy:  None visualized.  IMPRESSION: 1.  Numerous bilateral thyroid nodules. 2.  Dominant nodule on the right is complex and measures up to 4.7 cm. 3.  Dominant nodule on the left is complex and measures up to 3 cm. 4.  No nodules in #2 and #3 meet consensus criteria for biopsy. Ultrasound guided fine needle aspiration should be considered as per the radiology consensus statement:  Management of Thyroid Nodules Detected as [place]:  Society of Radiologists in Ultrasound Consensus Conference Statement. Radiology 2005; [id number].  Original Report Authenticated By: Harley Hallmark, M.D.   Dg C-arm 1-60 Min  01/09/2012  *RADIOLOGY REPORT*  Clinical Data: Cervical fusion.  C-ARM 1-60 MIN,CERVICAL SPINE - 2-3 VIEW  Comparison: Cervical spine MRI 12/31/2011.  Findings: Lateral spot film demonstrates a spinal needle marking the C6-7 level.  The second film demonstrates anterior plate and screws and interbody bone spacer fusing C6-7.  Normal alignment. No complicating features.  IMPRESSION: C6-7 fusion.  Original Report Authenticated By: P. Loralie Champagne, M.D.    Antibiotics:  Anti-infectives     Start     Dose/Rate Route Frequency Ordered Stop   01/09/12 1830   ceFAZolin (ANCEF) IVPB 1 g/50 mL premix  Status:  Discontinued        1 g 100 mL/hr over 30 Minutes Intravenous Every 8 hours  01/09/12 1350 01/09/12 2044   01/09/12 1136   bacitracin 50,000 Units in sodium chloride irrigation 0.9 % 500 mL irrigation  Status:  Discontinued          As needed 01/09/12 1136 01/09/12 1223   01/09/12 1026   bacitracin 08657 UNITS injection  Status:  Discontinued     Comments: Worthy Flank: cabinet override         01/09/12 1026 01/09/12 2044   01/08/12 1417   ceFAZolin (ANCEF) IVPB 1 g/50 mL premix        1 g 100 mL/hr over 30 Minutes Intravenous 60 min pre-op 01/08/12 1417 01/09/12 1030          Discharge Exam: Blood pressure 139/92, pulse 104, temperature 98.2 F (36.8 C), temperature source Oral, resp. rate 16, SpO2 97.00%. Neurologic: Grossly normal Incision ok  Discharge Medications:   Medication List  As of 01/10/2012  7:14 AM   ASK your doctor about these medications         cetirizine 10 MG tablet   Commonly known as: ZYRTEC   Take 10 mg by mouth daily.      cyclobenzaprine 10 MG tablet   Commonly known as: FLEXERIL  Take 10 mg by mouth 3 (three) times daily as needed. For pain      HYDROcodone-acetaminophen 5-325 MG per tablet   Commonly known as: NORCO   Take 1 tablet by mouth every 6 (six) hours as needed. For pain      ibuprofen 800 MG tablet   Commonly known as: ADVIL,MOTRIN   Take 800 mg by mouth every 8 (eight) hours as needed. For pain            Disposition: home   Final Dx: acdf c6-7       Signed: Alajia Schmelzer S 01/10/2012, 7:14 AM

## 2012-01-13 ENCOUNTER — Encounter (HOSPITAL_COMMUNITY): Payer: Self-pay | Admitting: Neurological Surgery

## 2012-01-22 ENCOUNTER — Other Ambulatory Visit: Payer: Self-pay | Admitting: Family Medicine

## 2012-01-22 DIAGNOSIS — E042 Nontoxic multinodular goiter: Secondary | ICD-10-CM

## 2012-01-29 ENCOUNTER — Other Ambulatory Visit (HOSPITAL_COMMUNITY)
Admission: RE | Admit: 2012-01-29 | Discharge: 2012-01-29 | Disposition: A | Discharge status: Home / Self Care | Payer: Managed Care, Other (non HMO) | Source: Ambulatory Visit | Attending: Diagnostic Radiology | Admitting: Diagnostic Radiology

## 2012-01-29 ENCOUNTER — Ambulatory Visit
Admission: RE | Admit: 2012-01-29 | Discharge: 2012-01-29 | Disposition: A | Discharge status: Home / Self Care | Payer: Managed Care, Other (non HMO) | Source: Ambulatory Visit | Attending: Family Medicine | Admitting: Family Medicine

## 2012-01-29 DIAGNOSIS — E042 Nontoxic multinodular goiter: Secondary | ICD-10-CM

## 2012-01-29 DIAGNOSIS — E041 Nontoxic single thyroid nodule: Secondary | ICD-10-CM | POA: Insufficient documentation

## 2012-05-07 ENCOUNTER — Ambulatory Visit (INDEPENDENT_AMBULATORY_CARE_PROVIDER_SITE_OTHER): Payer: Managed Care, Other (non HMO) | Admitting: General Surgery

## 2012-05-22 IMAGING — US US SOFT TISSUE HEAD/NECK
1 series · 13 of 25 positions shown · non-contrast
Comparison: Cervical spine MRI 12/31/2011.

CLINICAL DATA: 50-year-old female with thyroid nodule detected on
the MRI.

THYROID ULTRASOUND
TECHNIQUE: Ultrasound examination of the thyroid gland and adjacent
soft tissues was performed.

[Series 1: us soft tissue head/neck · 0.10mm/px · 13 of 59 slices shown]
[im 1/59]
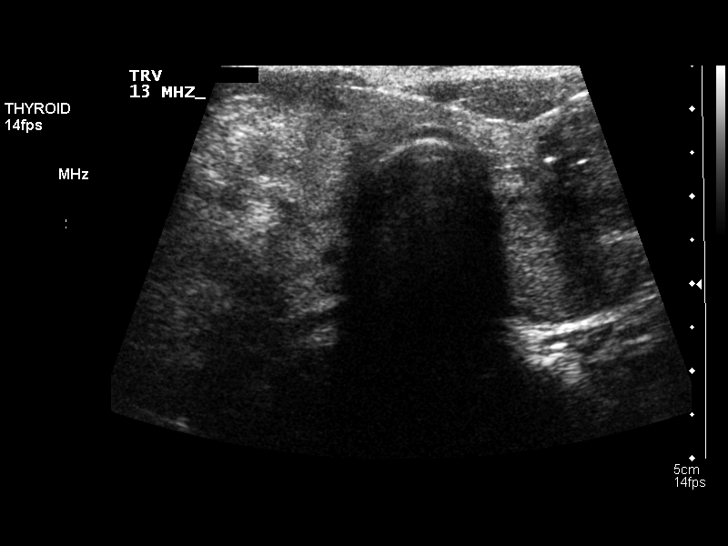
[im 5/59]
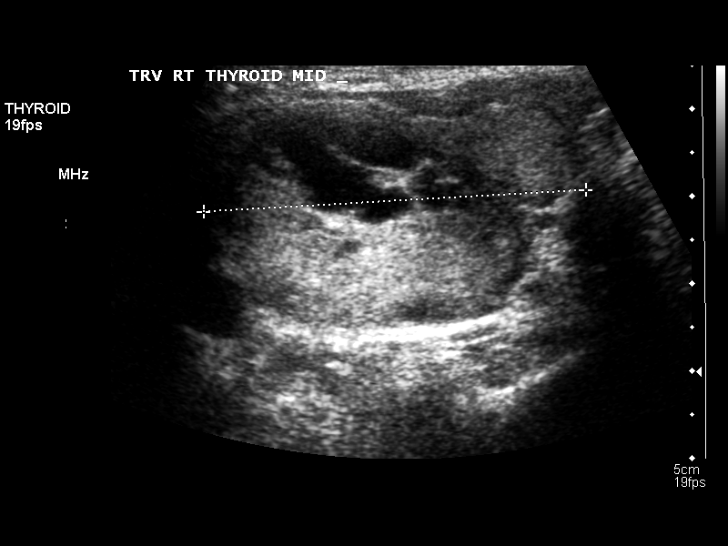
[im 10/59]
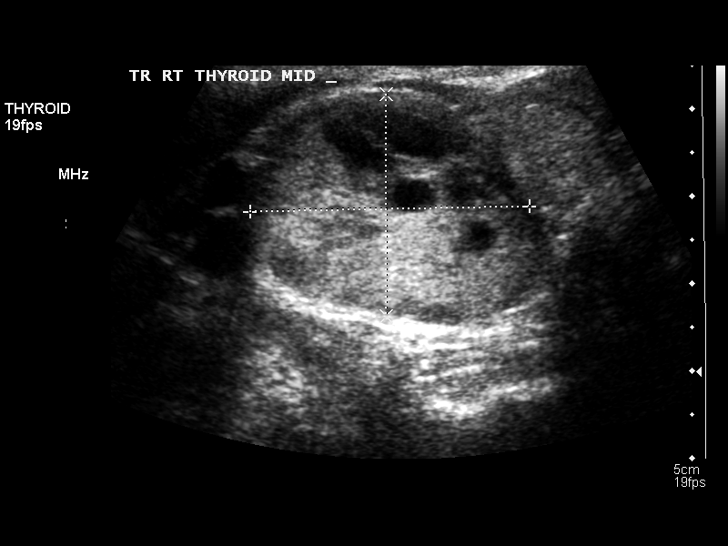
[im 15/59]
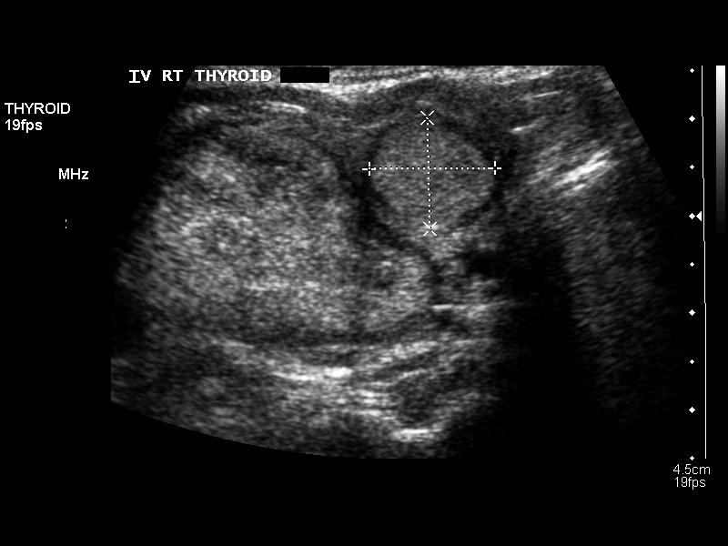
[im 20/59]
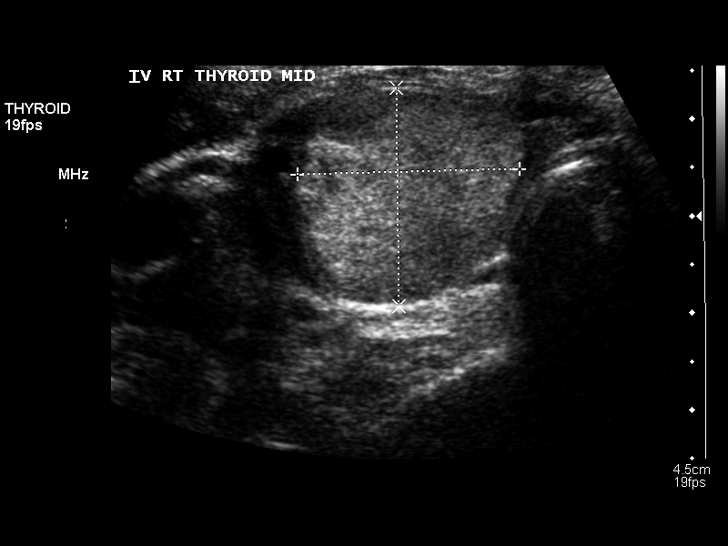
[im 25/59]
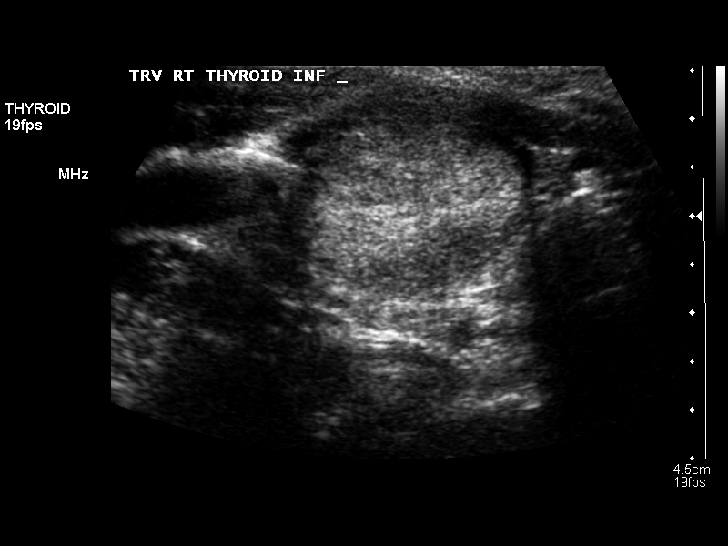
[im 30/59]
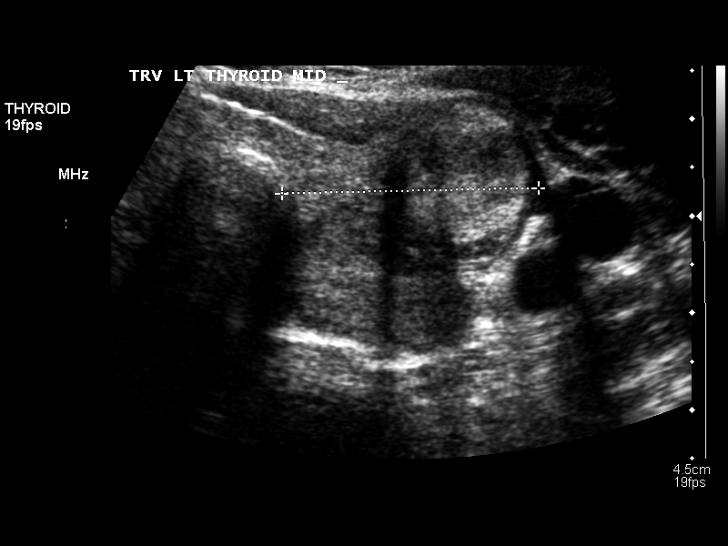
[im 34/59]
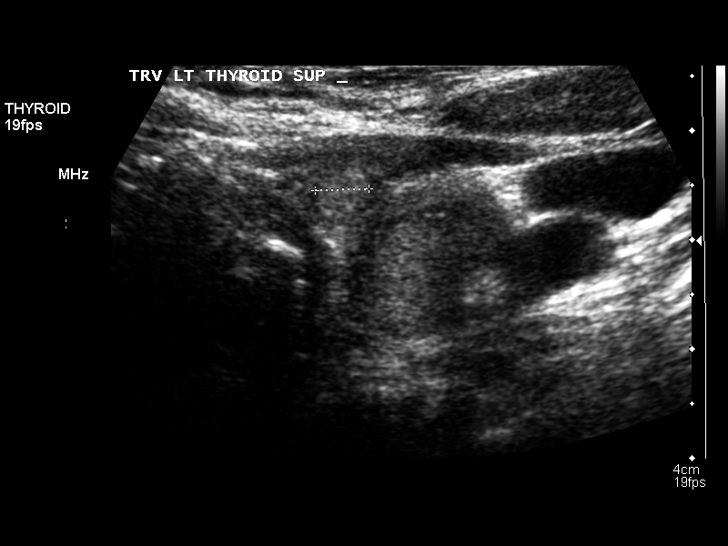
[im 39/59]
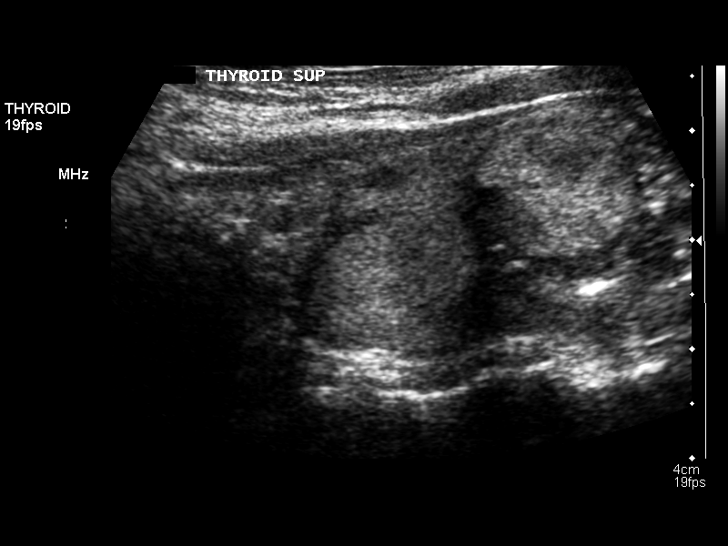
[im 44/59]
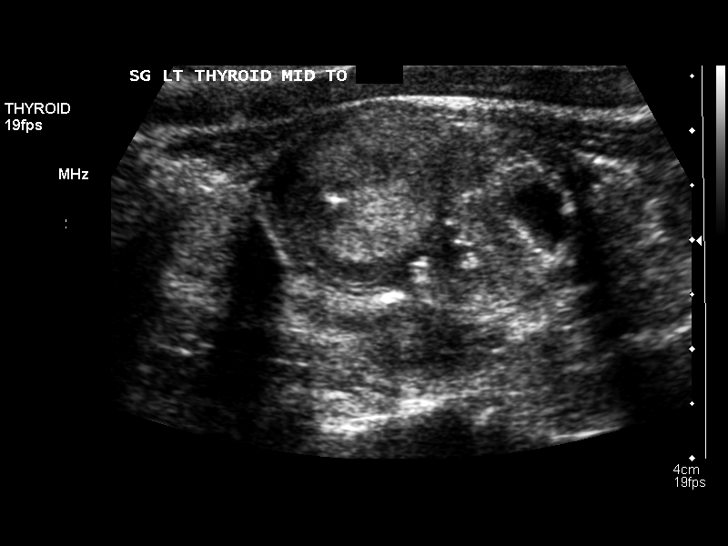
[im 49/59]
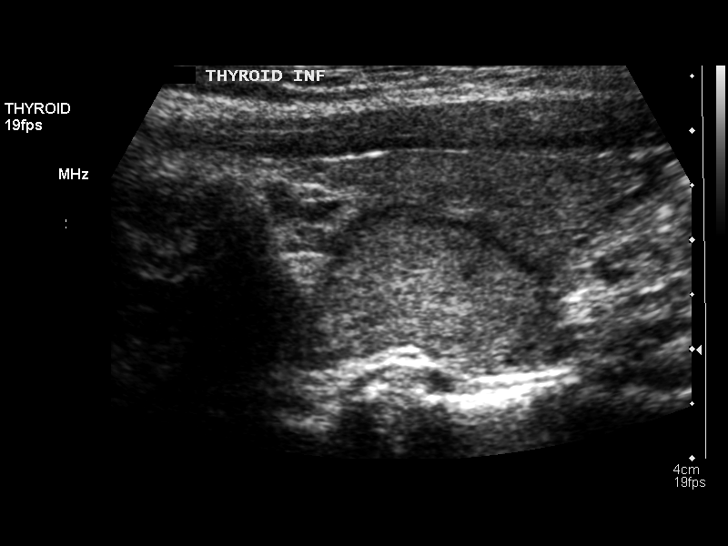
[im 54/59]
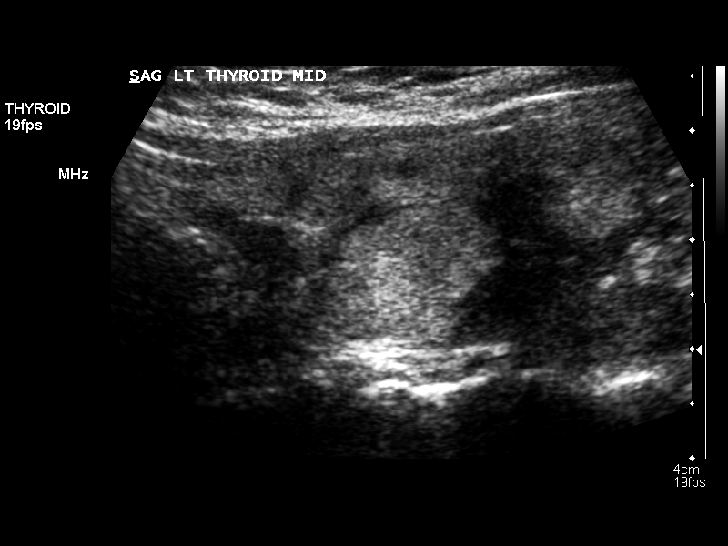
[im 59/59]
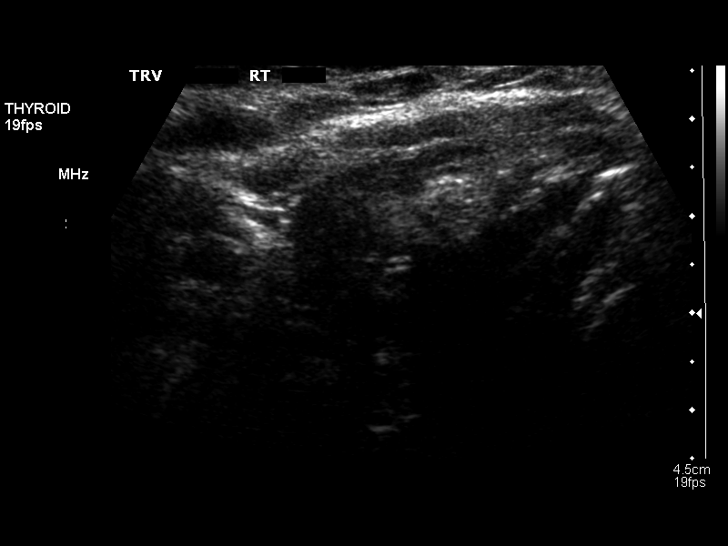

[13 of 25 positions shown; findings below may reference images not displayed]

FINDINGS: Right thyroid lobe:  7.5 x 2.7 x 4.4 cm.
Left thyroid lobe:  5.0 x 2.0 x 2.6 cm.
Isthmus:  3 mm.

Focal nodules:
Right:  Large solid and cystic central nodule is complex measuring
4.7 x 3.2 x 2.5 cm.  A smaller adjacent nearly isoechoic nodule
without hypervascularity measures up to 15 mm.  A nearly isoechoic
solid nodule in the right lower pole with vascularity measures
x 1.1 x 1.5 cm.
Isthmus:  None:
Left:  Solid and cystic complex nodule in the left lower pole
measures 3.0 x 1.9 x 1.8 cm.  Multiple additional solid nodules
which are nearly isoechoic and show vascularity otherwise measure
up to 2.0 cm.

Lymphadenopathy:  None visualized.
IMPRESSION: 1.  Numerous bilateral thyroid nodules.
2.  Dominant nodule on the right is complex and measures up to
cm.
3.  Dominant nodule on the left is complex and measures up to 3 cm.
4.  No nodules in #2 and #3 meet consensus criteria for biopsy.
Ultrasound guided fine needle aspiration should be considered as
per the radiology consensus statement:
 Management of Thyroid Nodules Detected as US:  Society of
Radiologists in Ultrasound Consensus Conference Statement.

## 2012-06-17 IMAGING — US US THYROID BIOPSY
1 series · 13 of 20 positions shown · non-contrast
Comparison: none

CLINICAL HISTORY: Multinodular goiter and bilateral dominant
nodules.

[Series 1: us thyroid biopsy · 0.09mm/px · 20 acquisitions, 13 frames shown]
[im 1/20]
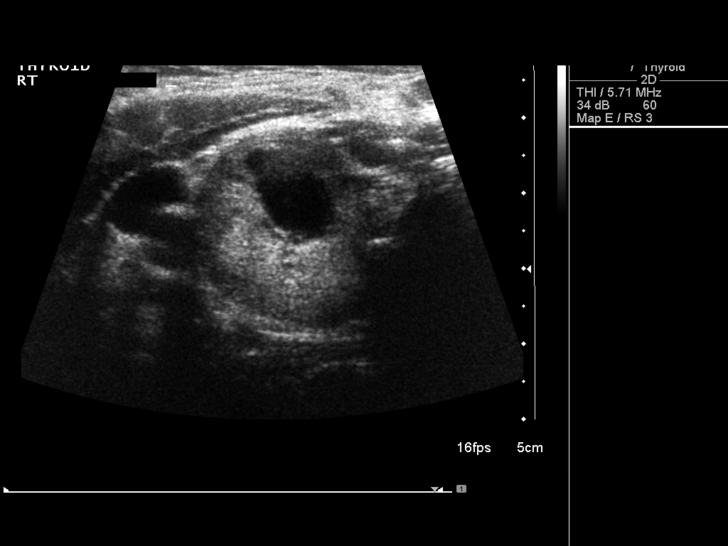
[im 3/20]
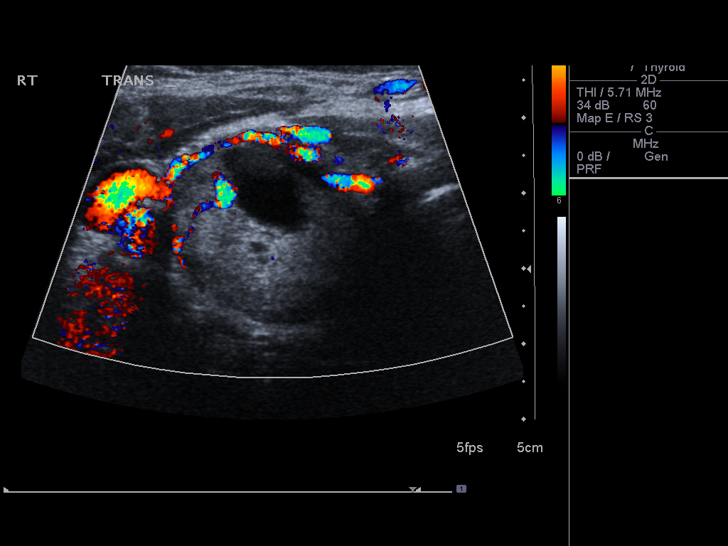
[im 4/20]
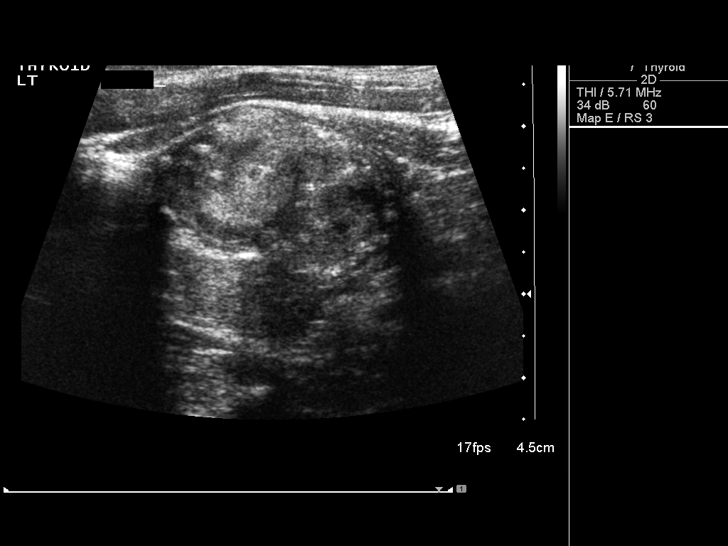
[im 6/20]
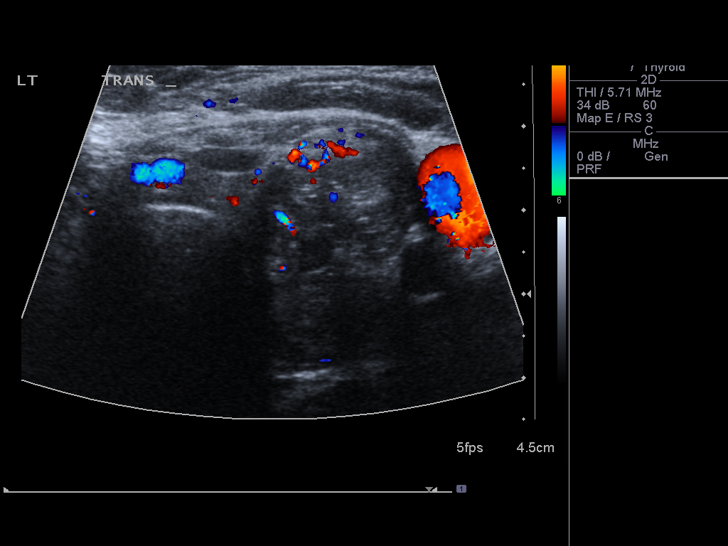
[im 7/20]
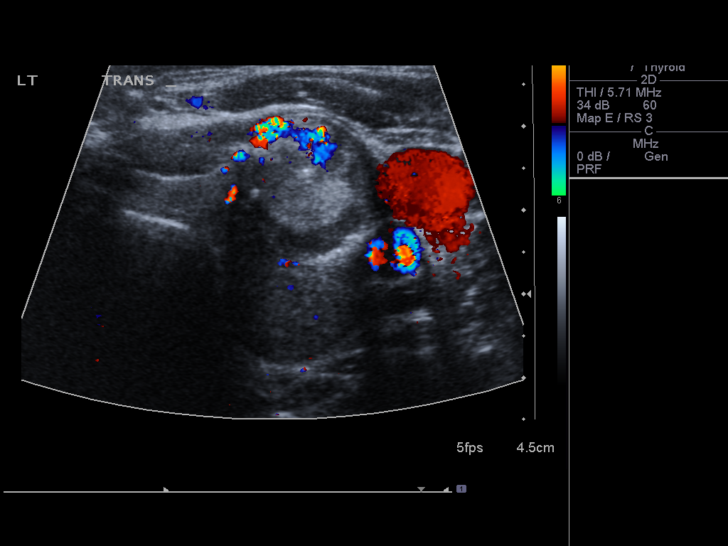
[im 9/20]
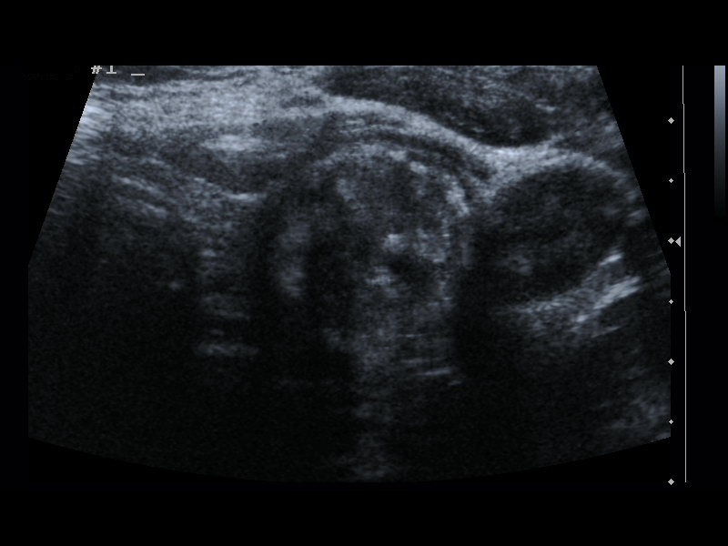
[im 11/20]
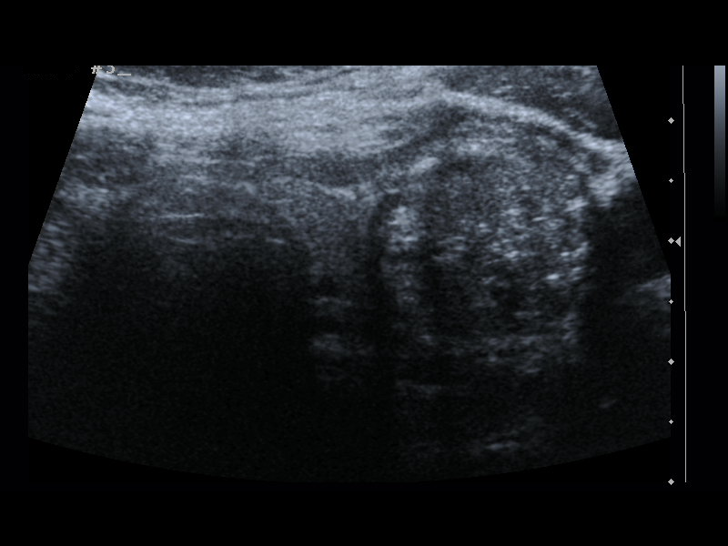
[im 12/20]
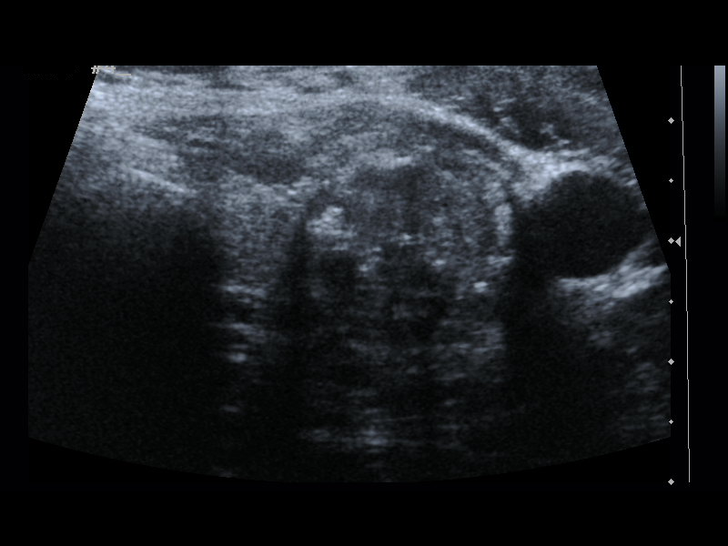
[im 14/20]
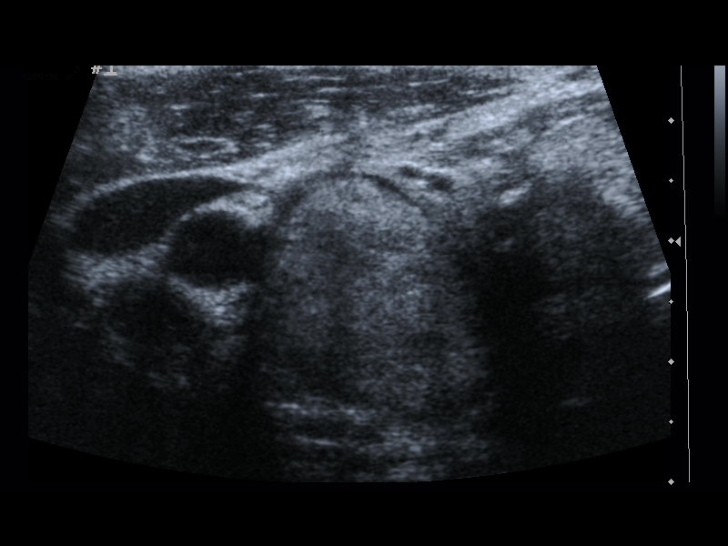
[im 15/20]
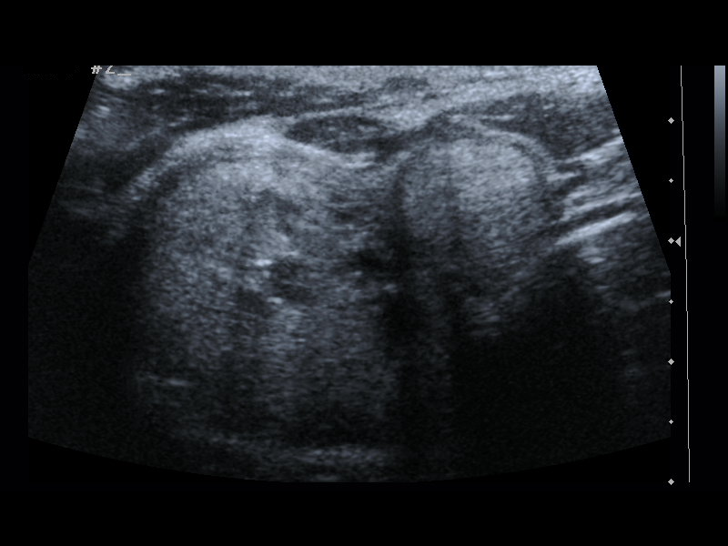
[im 17/20]
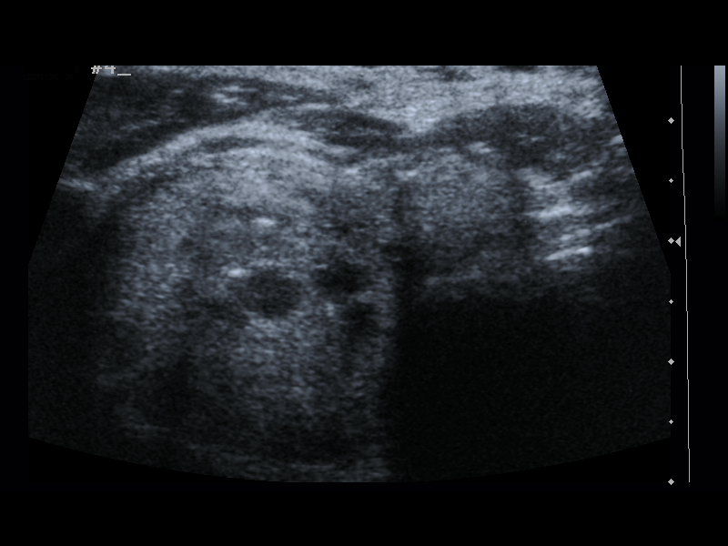
[im 18/20]
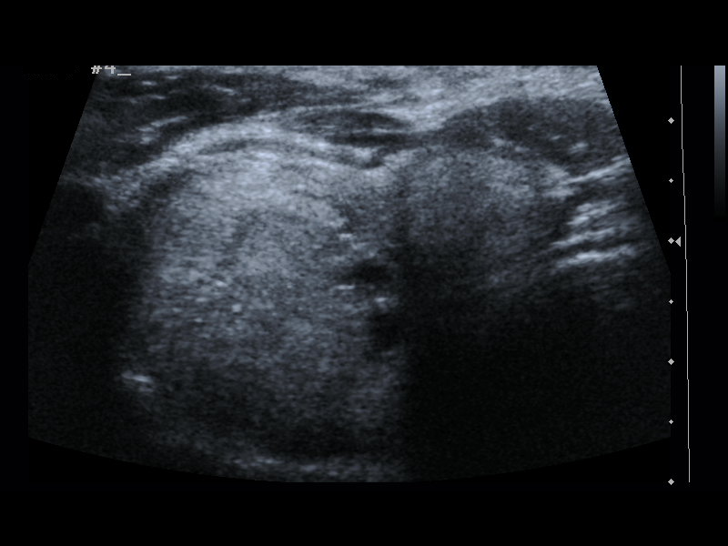
[im 20/20]
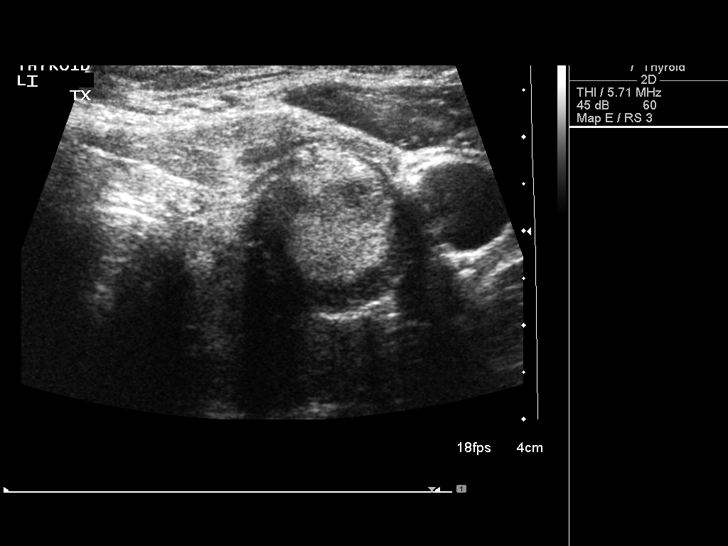

[13 of 20 positions shown; findings below may reference images not displayed]

PROCEDURE(S): ULTRASOUND GUIDED FINE NEEDLE ASPIRATION OF BILATERAL
THYROID NODULE

Medications:None

Moderate sedation time:None

Fluoroscopy time: None

Procedure:The procedure was explained to the patient.  The risks
and benefits of the procedure were discussed and the patient's
questions were addressed.  Informed consent was obtained from the
patient.  The neck was evaluated with ultrasound.  The neck was
prepped with Betadine and a sterile drape was placed.  The left
side of the neck was anesthetized with lidocaine.  Four ultrasound
guided fine needle aspirations were obtained within the left
thyroid nodule using 25 gauge needles.  The right side of neck was
anesthetized with 1% lidocaine. Four ultrasound guided fine needle
aspirations were obtained in the right thyroid nodule using 25
gauge needles.
FINDINGS: Complex heterogeneous nodule in the right thyroid lobe
with cystic components.  The left dominant thyroid nodule has
multiple echoes within it and findings consistent with multiple
calcifications.

Complications: None
IMPRESSION: Ultrasound guided biopsy of bilateral thyroid nodules.

## 2012-07-11 NOTE — Pre-Procedure Instructions (Signed)
20 SHANIK KOVALEV   07/11/2012   Your procedure is scheduled on: Wednesday, November 13th   Report to [facility] at 6:30 AM.  Call this number if you have problems the morning of surgery: [phone]   Remember:   Do not eat food or drink any liquids:After Midnight Tuesday.    Take these medicines the morning of surgery with A SIP OF WATER: Norco, Zyrtec   Do not wear jewelry, make-up or nail polish.  Do not wear lotions, powders, or perfumes. You may NOT wear deodorant.  LADIES---Do not shave 48 hours prior to surgery.   Do not bring valuables to the hospital.  Contacts, dentures or bridgework may not be worn into surgery.   Leave suitcase in the car. After surgery it may be brought to your room.  For patients admitted to the hospital, checkout time is 11:00 AM the day of discharge.   Patients discharged the day of surgery will not be allowed to drive home.   Name and phone number of your driver:    Special Instructions: Shower using CHG 2 nights before surgery and the night before surgery.  If you shower the day of surgery use CHG.  Use special wash - you have one bottle of CHG for all showers.  You should use approximately 1/3 of the bottle for each shower.   Please read over the following fact sheets that you were given: Pain Booklet, Coughing and Deep Breathing, MRSA Information and Surgical Site Infection Prevention

## 2012-07-11 NOTE — H&P (Signed)
Assessment  . Esophageal reflux   (530.81) . 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 Recommend total thyroidectomy. We discussed the nature of the surgery, the risk of recurrent nerve injury, hypocalcemia. We discussed using the previous incision which is not in the eye dilatation or at the ideal angle but we will try to make the best of it. All questions were answered.   Also recommend she cut dramatically back on her caffeine consumption which is causing her reflux. Amended : Serena Colonel  M.D.; 04/01/2012 10:39 AM EST. Reason For Visit  Betty Kelly is here today at the kind request of Balan, Bindubal for consultation and opinion for thyroid. HPI  Recently incidental finding of thyroid nodules. Her sister was treated for papillary carcinoma several years ago. There's a 4.7 cm nodule on the right, 3 cm on the left. 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 She has recovered nicely from her neck surgery. She has some reflux symptoms including dysphagia in the morning. She drinks a lot of coffee and Pepsi. Allergies  No Known Drug Allergies. Current Meds  No Reported Medications;; RPT. PSH  Neck Surgery. Family Hx  Family history of Alzheimer Disease Family history of Coronary Artery Disease Family history of Thyroid Cancer. Personal Hx  Former Smoker [phone]); 2011 Never Drank Alcohol. ROS  12 system ROS was obtained and reviewed on the Health Maintenance form dated today.  Positive responses are shown above.  If the symptom is not checked, the patient has denied it. Vital Signs   Recorded by [facility] on 01 Apr 2012 09:16 AM BP:112/60,  Height: 66 in, Weight: 167 lb, BMI: 27 kg/m2,  BSA Calculated: 1.86 ,  BMI Calculated: 26.95. Physical Exam  APPEARANCE: Well developed, well nourished, in no acute distress.  Normal affect, in a pleasant mood.  Oriented to time, place and person. COMMUNICATION: Normal voice     HEAD & FACE:  No scars, lesions or masses of head and face.  Sinuses nontender to palpation.  Salivary glands without mass or tenderness.  Facial strength symmetric.  No facial lesion, scars, or mass. EYES: EOMI with normal primary gaze alignment. Visual acuity grossly intact.  PERRLA EXTERNAL EAR & NOSE: No scars, lesions or masses  EAC & TYMPANIC MEMBRANE:  EAC shows no obstructing lesions or debris and tympanic membranes are normal bilaterally with good movement to insufflation. GROSS HEARING: Normal  TMJ:  Nontender  INTRANASAL EXAM: No polyps or purulence.  NASOPHARYNX: Normal, without lesions. LIPS, TEETH & GUMS: No lip lesions, normal dentition and normal gums. ORAL CAVITY/OROPHARYNX:  Oral mucosa moist without lesion or asymmetry of the palate, tongue, tonsil or posterior pharynx. LARYNX (mirror exam):  No lesions of the epiglottis, false cord or TVC's and cords move well to phonation. HYPOPHARYNX (mirror exam): No lesions, asymmetry or pooling of secretions. NECK:  Supple without adenopathy or mass. THYROID: Bilateral nodules palpable, larger on the right.  NEUROLOGIC:  No gross CN deficits. No nystagmus noted.   LYMPHATIC:  No enlarged nodes palpable. Signature  Electronically signed by : Serena Colonel  M.D.; 04/01/2012 10:38 AM EST. Electronically signed by : Serena Colonel  M.D.; 04/01/2012 10:39 AM EST.

## 2012-07-13 ENCOUNTER — Encounter (HOSPITAL_COMMUNITY)
Admission: RE | Admit: 2012-07-13 | Discharge: 2012-07-13 | Disposition: A | Discharge status: Home / Self Care | Payer: Managed Care, Other (non HMO) | Source: Ambulatory Visit | Attending: Otolaryngology | Admitting: Otolaryngology

## 2012-07-13 ENCOUNTER — Encounter (HOSPITAL_COMMUNITY): Payer: Self-pay

## 2012-07-13 LAB — HCG, SERUM, QUALITATIVE: Preg, Serum: NEGATIVE

## 2012-07-13 LAB — SURGICAL PCR SCREEN
MRSA, PCR: NEGATIVE
Staphylococcus aureus: NEGATIVE

## 2012-07-13 LAB — CBC
HCT: 41.6 % (ref 36.0–46.0)
Hemoglobin: 13.8 g/dL (ref 12.0–15.0)
RBC: 4.66 MIL/uL (ref 3.87–5.11)

## 2012-07-21 MED ORDER — CEFAZOLIN SODIUM-DEXTROSE 2-3 GM-% IV SOLR
2.0000 g | INTRAVENOUS | Status: DC
  Filled 2012-07-21: qty 50

## 2012-07-22 ENCOUNTER — Encounter (HOSPITAL_COMMUNITY): Payer: Self-pay | Admitting: General Practice

## 2012-07-22 ENCOUNTER — Encounter (HOSPITAL_COMMUNITY): Payer: Self-pay | Admitting: *Deleted

## 2012-07-22 ENCOUNTER — Encounter (HOSPITAL_COMMUNITY): Payer: Self-pay | Admitting: Anesthesiology

## 2012-07-22 ENCOUNTER — Ambulatory Visit (HOSPITAL_COMMUNITY): Payer: Managed Care, Other (non HMO) | Admitting: Anesthesiology

## 2012-07-22 ENCOUNTER — Encounter (HOSPITAL_COMMUNITY)
Admission: RE | Disposition: A | Discharge status: Home / Self Care | Payer: Self-pay | Source: Ambulatory Visit | Attending: Otolaryngology

## 2012-07-22 ENCOUNTER — Observation Stay (HOSPITAL_COMMUNITY)
Admission: RE | Admit: 2012-07-22 | Discharge: 2012-07-23 | Disposition: A | Discharge status: Home / Self Care | Payer: Managed Care, Other (non HMO) | Source: Ambulatory Visit | Attending: Otolaryngology | Admitting: Otolaryngology

## 2012-07-22 DIAGNOSIS — E042 Nontoxic multinodular goiter: Principal | ICD-10-CM | POA: Insufficient documentation

## 2012-07-22 DIAGNOSIS — Z01812 Encounter for preprocedural laboratory examination: Secondary | ICD-10-CM | POA: Insufficient documentation

## 2012-07-22 DIAGNOSIS — E890 Postprocedural hypothyroidism: Secondary | ICD-10-CM

## 2012-07-22 HISTORY — PX: THYROIDECTOMY: SHX17

## 2012-07-22 LAB — CALCIUM: Calcium: 8.1 mg/dL — ABNORMAL LOW (ref 8.4–10.5)

## 2012-07-22 SURGERY — THYROIDECTOMY
Anesthesia: General | Site: Neck | Laterality: Bilateral | Wound class: Clean

## 2012-07-22 MED ORDER — LIDOCAINE-EPINEPHRINE 1 %-1:100000 IJ SOLN
INTRAMUSCULAR | Status: DC | PRN
  Administered 2012-07-22: 20 mL

## 2012-07-22 MED ORDER — LORATADINE 10 MG PO TABS
10.0000 mg | ORAL_TABLET | Freq: Every day | ORAL | Status: DC
  Filled 2012-07-22: qty 1

## 2012-07-22 MED ORDER — METOCLOPRAMIDE HCL 5 MG/ML IJ SOLN
5.0000 mg | Freq: Once | INTRAMUSCULAR | Status: AC
  Administered 2012-07-22: 5 mg via INTRAVENOUS

## 2012-07-22 MED ORDER — FENTANYL CITRATE 0.05 MG/ML IJ SOLN
INTRAMUSCULAR | Status: DC | PRN
  Administered 2012-07-22: 50 ug via INTRAVENOUS
  Administered 2012-07-22: 100 ug via INTRAVENOUS

## 2012-07-22 MED ORDER — LACTATED RINGERS IV SOLN
INTRAVENOUS | Status: DC | PRN
  Administered 2012-07-22 (×2): via INTRAVENOUS

## 2012-07-22 MED ORDER — HYDROMORPHONE HCL PF 1 MG/ML IJ SOLN
0.2500 mg | INTRAMUSCULAR | Status: DC | PRN
  Administered 2012-07-22 (×2): 0.5 mg via INTRAVENOUS

## 2012-07-22 MED ORDER — NEOMYCIN-BACITRACIN ZN-POLYMYX 5-400-10000 OP OINT
TOPICAL_OINTMENT | OPHTHALMIC | Status: AC
  Filled 2012-07-22: qty 3.5

## 2012-07-22 MED ORDER — DEXTROSE-NACL 5-0.9 % IV SOLN
INTRAVENOUS | Status: DC
  Administered 2012-07-22: 1000 mL via INTRAVENOUS
  Administered 2012-07-23: 04:00:00 via INTRAVENOUS

## 2012-07-22 MED ORDER — ARTIFICIAL TEARS OP OINT
TOPICAL_OINTMENT | OPHTHALMIC | Status: DC | PRN
  Administered 2012-07-22: 1 via OPHTHALMIC

## 2012-07-22 MED ORDER — ONDANSETRON HCL 4 MG/2ML IJ SOLN
INTRAMUSCULAR | Status: DC | PRN
  Administered 2012-07-22: 4 mg via INTRAVENOUS

## 2012-07-22 MED ORDER — HYDROCODONE-ACETAMINOPHEN 7.5-325 MG PO TABS: 1.0000 | ORAL_TABLET | Freq: Four times a day (QID) | ORAL | Status: DC | PRN

## 2012-07-22 MED ORDER — HYDROCODONE-ACETAMINOPHEN 5-325 MG PO TABS
1.0000 | ORAL_TABLET | ORAL | Status: DC | PRN
  Administered 2012-07-23: 2 via ORAL
  Filled 2012-07-22: qty 2

## 2012-07-22 MED ORDER — LEVOTHYROXINE SODIUM 100 MCG PO TABS: 100.0000 ug | ORAL_TABLET | Freq: Every day | ORAL | Status: DC

## 2012-07-22 MED ORDER — NEOSTIGMINE METHYLSULFATE 1 MG/ML IJ SOLN
INTRAMUSCULAR | Status: DC | PRN
  Administered 2012-07-22: 3 mg via INTRAVENOUS

## 2012-07-22 MED ORDER — PROMETHAZINE HCL 25 MG PO TABS
25.0000 mg | ORAL_TABLET | Freq: Four times a day (QID) | ORAL | Status: DC | PRN
  Administered 2012-07-22: 25 mg via ORAL
  Filled 2012-07-22: qty 1

## 2012-07-22 MED ORDER — ROCURONIUM BROMIDE 100 MG/10ML IV SOLN
INTRAVENOUS | Status: DC | PRN
  Administered 2012-07-22: 40 mg via INTRAVENOUS

## 2012-07-22 MED ORDER — MIDAZOLAM HCL 5 MG/5ML IJ SOLN
INTRAMUSCULAR | Status: DC | PRN
  Administered 2012-07-22: 2 mg via INTRAVENOUS

## 2012-07-22 MED ORDER — LEVOTHYROXINE SODIUM 100 MCG PO TABS
100.0000 ug | ORAL_TABLET | Freq: Every day | ORAL | Status: DC
  Administered 2012-07-23: 100 ug via ORAL
  Filled 2012-07-22 (×2): qty 1

## 2012-07-22 MED ORDER — LIDOCAINE HCL 4 % MT SOLN
OROMUCOSAL | Status: DC | PRN
  Administered 2012-07-22: 4 mL via TOPICAL

## 2012-07-22 MED ORDER — METOCLOPRAMIDE HCL 5 MG/ML IJ SOLN
INTRAMUSCULAR | Status: AC
  Administered 2012-07-22: 5 mg via INTRAVENOUS
  Filled 2012-07-22: qty 2

## 2012-07-22 MED ORDER — IBUPROFEN 100 MG/5ML PO SUSP
400.0000 mg | Freq: Four times a day (QID) | ORAL | Status: DC | PRN
  Administered 2012-07-22 (×2): 400 mg via ORAL
  Filled 2012-07-22 (×2): qty 20

## 2012-07-22 MED ORDER — PROPOFOL 10 MG/ML IV BOLUS
INTRAVENOUS | Status: DC | PRN
  Administered 2012-07-22: 200 mg via INTRAVENOUS

## 2012-07-22 MED ORDER — 0.9 % SODIUM CHLORIDE (POUR BTL) OPTIME
TOPICAL | Status: DC | PRN
  Administered 2012-07-22: 1000 mL

## 2012-07-22 MED ORDER — PROMETHAZINE HCL 25 MG RE SUPP: 25.0000 mg | Freq: Four times a day (QID) | RECTAL | Status: DC | PRN

## 2012-07-22 MED ORDER — ONDANSETRON HCL 4 MG/2ML IJ SOLN
4.0000 mg | Freq: Four times a day (QID) | INTRAMUSCULAR | Status: DC | PRN
  Administered 2012-07-22 – 2012-07-23 (×2): 4 mg via INTRAVENOUS
  Filled 2012-07-22 (×2): qty 2

## 2012-07-22 MED ORDER — PHENYLEPHRINE HCL 10 MG/ML IJ SOLN
INTRAMUSCULAR | Status: DC | PRN
  Administered 2012-07-22: 80 ug via INTRAVENOUS

## 2012-07-22 MED ORDER — LIDOCAINE-EPINEPHRINE 1 %-1:100000 IJ SOLN
INTRAMUSCULAR | Status: AC
  Filled 2012-07-22: qty 1

## 2012-07-22 MED ORDER — DEXAMETHASONE SODIUM PHOSPHATE 4 MG/ML IJ SOLN
INTRAMUSCULAR | Status: DC | PRN
  Administered 2012-07-22: 4 mg via INTRAVENOUS

## 2012-07-22 MED ORDER — GLYCOPYRROLATE 0.2 MG/ML IJ SOLN
INTRAMUSCULAR | Status: DC | PRN
  Administered 2012-07-22: 0.4 mg via INTRAVENOUS

## 2012-07-22 SURGICAL SUPPLY — 42 items
ATTRACTOMAT 16X20 MAGNETIC DRP (DRAPES) ×2 IMPLANT
CANISTER SUCTION 2500CC (MISCELLANEOUS) ×2 IMPLANT
CLEANER TIP ELECTROSURG 2X2 (MISCELLANEOUS) ×2 IMPLANT
CLOTH BEACON ORANGE TIMEOUT ST (SAFETY) ×2 IMPLANT
CONT SPEC 4OZ CLIKSEAL STRL BL (MISCELLANEOUS) ×2 IMPLANT
CORDS BIPOLAR (ELECTRODE) ×2 IMPLANT
COVER SURGICAL LIGHT HANDLE (MISCELLANEOUS) ×2 IMPLANT
DECANTER SPIKE VIAL GLASS SM (MISCELLANEOUS) ×2 IMPLANT
DERMABOND ADVANCED (GAUZE/BANDAGES/DRESSINGS) ×1
DERMABOND ADVANCED .7 DNX12 (GAUZE/BANDAGES/DRESSINGS) ×1 IMPLANT
DRAIN SNY 10 ROU (WOUND CARE) IMPLANT
ELECT COATED BLADE 2.86 ST (ELECTRODE) ×2 IMPLANT
ELECT REM PT RETURN 9FT ADLT (ELECTROSURGICAL) ×2
ELECTRODE REM PT RTRN 9FT ADLT (ELECTROSURGICAL) ×1 IMPLANT
EVACUATOR SILICONE 100CC (DRAIN) ×2 IMPLANT
GAUZE SPONGE 4X4 16PLY XRAY LF (GAUZE/BANDAGES/DRESSINGS) ×8 IMPLANT
GLOVE BIO SURGEON STRL SZ 6.5 (GLOVE) IMPLANT
GLOVE ECLIPSE 7.5 STRL STRAW (GLOVE) ×2 IMPLANT
GOWN PREVENTION PLUS LG XLONG (DISPOSABLE) ×6 IMPLANT
KIT BASIN OR (CUSTOM PROCEDURE TRAY) ×2 IMPLANT
KIT ROOM TURNOVER OR (KITS) ×2 IMPLANT
MARKER SKIN DUAL TIP RULER LAB (MISCELLANEOUS) ×2 IMPLANT
NEEDLE 27GAX1X1/2 (NEEDLE) ×2 IMPLANT
NS IRRIG 1000ML POUR BTL (IV SOLUTION) ×2 IMPLANT
PAD ARMBOARD 7.5X6 YLW CONV (MISCELLANEOUS) ×4 IMPLANT
PENCIL FOOT CONTROL (ELECTRODE) ×2 IMPLANT
SPECIMEN JAR MEDIUM (MISCELLANEOUS) IMPLANT
SPONGE INTESTINAL PEANUT (DISPOSABLE) IMPLANT
STAPLER VISISTAT 35W (STAPLE) ×2 IMPLANT
SUT CHROMIC 3 0 SH 27 (SUTURE) IMPLANT
SUT CHROMIC 4 0 PS 2 18 (SUTURE) ×4 IMPLANT
SUT ETHILON 3 0 PS 1 (SUTURE) IMPLANT
SUT ETHILON 5 0 P 3 18 (SUTURE) ×1
SUT NYLON ETHILON 5-0 P-3 1X18 (SUTURE) ×1 IMPLANT
SUT SILK 2 0 FS (SUTURE) ×2 IMPLANT
SUT SILK 3 0 REEL (SUTURE) IMPLANT
SUT SILK 4 0 REEL (SUTURE) ×6 IMPLANT
TOWEL OR 17X24 6PK STRL BLUE (TOWEL DISPOSABLE) ×2 IMPLANT
TOWEL OR 17X26 10 PK STRL BLUE (TOWEL DISPOSABLE) ×2 IMPLANT
TOWEL OR NON WOVEN STRL DISP B (DISPOSABLE) ×2 IMPLANT
TRAY ENT MC OR (CUSTOM PROCEDURE TRAY) ×2 IMPLANT
WATER STERILE IRR 1000ML POUR (IV SOLUTION) IMPLANT

## 2012-07-22 NOTE — Care Management Note (Signed)
  Page 1 of 1   07/22/2012     3:03:52 PM   CARE MANAGEMENT NOTE 07/22/2012  Patient:  Betty Kelly, Betty Kelly   Account Number:  [id number]  Date Initiated:  07/22/2012  Documentation initiated by:  Ronny Flurry  Subjective/Objective Assessment:   s/p THYROIDECTOMY     Action/Plan:   Anticipated DC Date:  07/23/2012   Anticipated DC Plan:  HOME/SELF CARE         Choice offered to / List presented to:             Status of service:  Completed, signed off Medicare Important Message given?   (If response is "NO", the following Medicare IM given date fields will be blank) Date Medicare IM given:   Date Additional Medicare IM given:    Discharge Disposition:    Per UR Regulation:  Reviewed for med. necessity/level of care/duration of stay  If discussed at Long Length of Stay Meetings, dates discussed:    Comments:

## 2012-07-22 NOTE — Anesthesia Procedure Notes (Signed)
Procedure Name: Intubation Date/Time: 07/22/2012 8:33 AM Performed by: Jerilee Hoh Pre-anesthesia Checklist: Patient identified, Emergency Drugs available, Suction available and Patient being monitored Patient Re-evaluated:Patient Re-evaluated prior to inductionOxygen Delivery Method: Circle system utilized Preoxygenation: Pre-oxygenation with 100% oxygen Intubation Type: IV induction Ventilation: Mask ventilation without difficulty Laryngoscope Size: Mac and 3 Grade View: Grade I Tube type: Oral Tube size: 7.5 mm Number of attempts: 1 Airway Equipment and Method: Stylet and LTA kit utilized Placement Confirmation: ETT inserted through vocal cords under direct vision,  positive ETCO2 and breath sounds checked- equal and bilateral Secured at: 21 cm Tube secured with: Tape Dental Injury: Teeth and Oropharynx as per pre-operative assessment

## 2012-07-22 NOTE — Interval H&P Note (Signed)
History and Physical Interval Note:  07/22/2012 8:17 AM  Betty Kelly  has presented today for surgery, with the diagnosis of THYROID MASS  The various methods of treatment have been discussed with the patient and family. After consideration of risks, benefits and other options for treatment, the patient has consented to  Procedure(s) (LRB) with comments: THYROIDECTOMY (Bilateral) - TOTAL as a surgical intervention .  The patient's history has been reviewed, patient examined, no change in status, stable for surgery.  I have reviewed the patient's chart and labs.  Questions were answered to the patient's satisfaction.     Kyle Luppino

## 2012-07-22 NOTE — Preoperative (Signed)
Beta Blockers   Reason not to administer Beta Blockers:Not Applicable 

## 2012-07-22 NOTE — Anesthesia Preprocedure Evaluation (Addendum)
Anesthesia Evaluation  Patient identified by MRN, date of birth, ID band Patient awake    Reviewed: Allergy & Precautions, H&P , NPO status , Patient's Chart, lab work & pertinent test results  Airway Mallampati: II      Dental   Pulmonary neg pulmonary ROS,  breath sounds clear to auscultation        Cardiovascular negative cardio ROS  Rhythm:Regular Rate:Normal     Neuro/Psych    GI/Hepatic negative GI ROS, Neg liver ROS,   Endo/Other  History of thyroid mass.  Renal/GU negative Renal ROS     Musculoskeletal   Abdominal   Peds  Hematology   Anesthesia Other Findings   Reproductive/Obstetrics                         Anesthesia Physical Anesthesia Plan  ASA: II  Anesthesia Plan: General   Post-op Pain Management:    Induction: Intravenous  Airway Management Planned: Oral ETT  Additional Equipment:   Intra-op Plan:   Post-operative Plan: Possible Post-op intubation/ventilation  Informed Consent:   Plan Discussed with: CRNA, Anesthesiologist and Surgeon  Anesthesia Plan Comments:         Anesthesia Quick Evaluation

## 2012-07-22 NOTE — Transfer of Care (Signed)
Immediate Anesthesia Transfer of Care Note  Patient: Betty Kelly  Procedure(s) Performed: Procedure(s) (LRB) with comments: THYROIDECTOMY (Bilateral)  Patient Location: PACU  Anesthesia Type:General  Level of Consciousness: awake, alert , oriented and patient cooperative  Airway & Oxygen Therapy: Patient Spontanous Breathing and Patient connected to nasal cannula oxygen  Post-op Assessment: Report given to PACU RN, Post -op Vital signs reviewed and stable and Patient moving all extremities  Post vital signs: Reviewed and stable  Complications: No apparent anesthesia complications

## 2012-07-22 NOTE — Anesthesia Postprocedure Evaluation (Signed)
  Anesthesia Post-op Note  Patient: Betty Kelly  Procedure(s) Performed: Procedure(s) (LRB) with comments: THYROIDECTOMY (Bilateral)  Patient Location: PACU  Anesthesia Type:General  Level of Consciousness: alert   Airway and Oxygen Therapy: Patient Spontanous Breathing  Post-op Pain: mild  Post-op Assessment: Post-op Vital signs reviewed  Post-op Vital Signs: Reviewed  Complications: No apparent anesthesia complications

## 2012-07-22 NOTE — Op Note (Signed)
OPERATIVE REPORT  DATE OF SURGERY: 07/22/2012  PATIENT:  Betty Kelly,  51 y.o. female  PRE-OPERATIVE DIAGNOSIS:  THYROID MASS  POST-OPERATIVE DIAGNOSIS:  THYROID MASS  PROCEDURE:  Procedure(s): THYROIDECTOMY  SURGEON:  Susy Frizzle, MD  ASSISTANTS: Aquilla Hacker, PA   ANESTHESIA:   general  EBL:  50 ml  DRAINS: (10 [place]) Jackson-Pratt drain(s) with closed bulb suction in the neck   LOCAL MEDICATIONS USED:  NONE  SPECIMEN:  Source of Specimen:  Total thyroidectomy  COUNTS:  YES  PROCEDURE DETAILS: The patient was taken to the operating room and placed on the operating table in the supine position. Following induction of general endotracheal anesthesia, the neck was prepped and draped in a standard fashion. The previous surgical scar and the neck was outlined with a marking pen and then extended across to the left side in a symmetric fashion. Electrocautery was used to incise the skin and subcutaneous tissue and through the platysma layer. Subplatysmal flaps were elevated superiorly to the thyroid notch and inferiorly to the clavicle. A self-retaining thyroid retractor was used throughout the case. The midline fascia was divided and the strap muscles were reflected off of the left lobe of the thyroid gland. There is a large firm mass replacing much of the left lobe. The left lobe was retracted medially as the dissection continued along the capsule. The superior vasculature were separately identified, ligated between clamps and divided. 4-0 silk ties were used throughout the case. The middle thyroid vein and the inferior vasculature was treated in a similar fashion. A suspected Inferior parathyroid was  identified and preserved with its blood supply. The recurrent laryngeal nerve was identified and followed up towards larynx. Berry's ligament was divided and the left lobe was brought off the trachea. The left lobe was marked with a suture. The right side was then dissected in a  similar fashion. There is a smaller firm nodule on the right was a little bit softer than the one on the left. The superior vasculature was dissected initially. The middle thyroid vein and inferior vasculature was then dissected in a suspected inferior parathyroid was also preserved on the right side. Initially, as the gland was brought forward and removed in its entirety, the recurrent nerve was not identified. After the thyroid was removed a separate large nodule was identified on the right side more posteriorly. This was separate from the majority of the thyroid gland. This was dissected out of its surrounding tissue as well, and sent with the original specimen marked as additional right thyroid lobe. Following that I was able to identify what appeared to possibly be the recurrent nerve additional dissection was accomplished around that area as there were several small blood vessels. Hemostasis was completed. The wound was irrigated. A drain was left in the wound and exited through the left-sided incision and secured in place with a nylon suture. The midline fascia, platysma layer, and a subcuticular closure was accomplished using interrupted 4-0 chromic sutures. Dermabond was used on the skin. The patient was then awakened, extubated and transferred to recovery in stable condition.  PATIENT DISPOSITION:  PACU - hemodynamically stable.

## 2012-07-22 NOTE — Addendum Note (Signed)
Addendum  created 07/22/12 1204 by Judie Petit, MD   Modules edited:Orders

## 2012-07-22 NOTE — Progress Notes (Signed)
   ENT Progress Note: s/p Procedure(s): THYROIDECTOMY   Subjective: Postop nausea and vomiting  Objective: Vital signs in last 24 hours: Temp:  [97.4 F (36.3 C)-97.9 F (36.6 C)] 97.8 F (36.6 C) (11/13 1345) Pulse Rate:  [61-120] 78  (11/13 1345) Resp:  [10-25] 18  (11/13 1345) BP: (117-155)/(71-87) 134/77 mmHg (11/13 1345) SpO2:  [98 %-100 %] 99 % (11/13 1345) Weight:  [77.111 kg (170 lb)] 77.111 kg (170 lb) (11/13 1424) Weight change:  Last BM Date: 07/22/12  Intake/Output from previous day:   Intake/Output this shift: Total I/O In: 2345 [P.O.:120; I.V.:2200; Other:25] Out: 115 [Drains:15; Blood:100]  Labs: No results found for this basename: WBC:2,HGB:2,HCT:2,PLT:2 in the last 72 hours  Basename 07/22/12 1110  NA --  K --  CL --  CO2 --  GLUCOSE --  BUN --  CALCIUM 8.5    Studies/Results: No results found.   PHYSICAL EXAM: Inc intact, no swelling or edema Min bloody JP outpt   Assessment/Plan: Treat N/V with phenergan and zofran, cont IVF PO intake as tol Monitor JP output and Ca+2 levels    Finesse Fielder 07/22/2012, 4:36 PM

## 2012-07-23 ENCOUNTER — Encounter (HOSPITAL_COMMUNITY): Payer: Self-pay | Admitting: Otolaryngology

## 2012-07-23 NOTE — Discharge Summary (Signed)
Physician Discharge Summary  Patient ID: Betty Kelly MRN: [id number] DOB/AGE: 11/19/1960 51 y.o.  Admit date: 07/22/2012 Discharge date: 07/23/2012  Admission Diagnoses:Thyroid mass  Discharge Diagnoses:  Active Problems:  * No active hospital problems. *    Discharged Condition: good  Hospital Course: no complications.   Consults: None  Significant Diagnostic Studies: labs: calcium checks  Treatments: surgery: total thyroidectomy  Discharge Exam: Blood pressure 118/72, pulse 96, temperature 98.4 F (36.9 C), temperature source Oral, resp. rate 18, height 5\' 6"  (1.676 m), weight 170 lb (77.111 kg), SpO2 99.00%. PHYSICAL EXAM: Awake and alert. Voice normal. [place];s negative. Incision excellent. JP removed.  Disposition: 01-Home or Self Care  Discharge Orders    Future Orders Please Complete By Expires   Diet - low sodium heart healthy      Increase activity slowly          Medication List     As of 07/23/2012  8:45 AM    TAKE these medications         cetirizine 10 MG tablet   Commonly known as: ZYRTEC   Take 10 mg by mouth daily as needed. For allergies      DEPO-PROVERA IM   Inject into the muscle every 3 (three) months.      HYDROcodone-acetaminophen 7.5-325 MG per tablet   Commonly known as: NORCO   Take 1 tablet by mouth every 6 (six) hours as needed for pain.      ibuprofen 800 MG tablet   Commonly known as: ADVIL,MOTRIN   Take 800 mg by mouth daily as needed. For pain      levothyroxine 100 MCG tablet   Commonly known as: SYNTHROID, LEVOTHROID   Take 1 tablet (100 mcg total) by mouth daily.      promethazine 25 MG suppository   Commonly known as: PHENERGAN   Place 1 suppository (25 mg total) rectally every 6 (six) hours as needed for nausea.           Follow-up Information    Follow up with Serena Colonel, MD. Schedule an appointment as soon as possible for a visit in 1 week.   Contact information:   [address] [place], [place] [address] [place] [address] [phone]          Signed: Serena Colonel 07/23/2012, 8:45 AM

## 2012-07-23 NOTE — Progress Notes (Signed)
DC HOME WITH HUSBAND, DC INSTRUCTIONS GIVEN TO PT, VERBALLY UNDERSTOOD. NO QUESTIONS ASKED

## 2015-10-10 ENCOUNTER — Other Ambulatory Visit (HOSPITAL_COMMUNITY)
Admission: RE | Admit: 2015-10-10 | Discharge: 2015-10-10 | Disposition: A | Discharge status: Home / Self Care | Payer: Managed Care, Other (non HMO) | Source: Ambulatory Visit | Attending: Family Medicine | Admitting: Family Medicine

## 2015-10-10 ENCOUNTER — Other Ambulatory Visit: Payer: Self-pay | Admitting: Family Medicine

## 2015-10-10 DIAGNOSIS — Z01419 Encounter for gynecological examination (general) (routine) without abnormal findings: Secondary | ICD-10-CM | POA: Insufficient documentation

## 2015-10-12 LAB — CYTOLOGY - PAP

## 2017-01-14 ENCOUNTER — Ambulatory Visit (INDEPENDENT_AMBULATORY_CARE_PROVIDER_SITE_OTHER): Payer: Managed Care, Other (non HMO) | Admitting: Orthopaedic Surgery

## 2017-01-14 ENCOUNTER — Encounter (INDEPENDENT_AMBULATORY_CARE_PROVIDER_SITE_OTHER): Payer: Self-pay | Admitting: Orthopaedic Surgery

## 2017-01-14 DIAGNOSIS — S92354A Nondisplaced fracture of fifth metatarsal bone, right foot, initial encounter for closed fracture: Secondary | ICD-10-CM | POA: Diagnosis not present

## 2017-01-14 NOTE — Progress Notes (Signed)
Office Visit Note   Patient: Betty Kelly           Date of Birth: 1961/03/14           MRN: [id number] Visit Date: 01/14/2017              Requested by: Betty Kelly, Candace, MD [phone] [address] [place], [place] [id number] PCP: Betty Kelly, Candace, MD   Assessment & Plan: Visit Diagnoses:  1. Closed nondisplaced fracture of fifth metatarsal bone of right foot, initial encounter     Plan: Patient has non-displaced comminuted fracture of the base of fifth metatarsal. We'll plan on treating this nonoperatively. Postop shoe for 4 weeks. Follow-up at that time with 3 view x-rays of the right foot.  Follow-Up Instructions: Return in about 4 weeks (around 02/11/2017).   Orders:  No orders of the defined types were placed in this encounter.  No orders of the defined types were placed in this encounter.     Procedures: No procedures performed   Clinical Data: No additional findings.   Subjective: Chief Complaint  Patient presents with  . Right Foot - Pain    Betty Kelly's 56 year old female sustained a right foot injury this weekend. She is diagnosed with a nondisplaced base of fifth metatarsal fracture. She's been wearing a postop shoe. She was seen at the urgent care. I was able to review the x-rays.    Review of Systems  Constitutional: Negative.   HENT: Negative.   Eyes: Negative.   Respiratory: Negative.   Cardiovascular: Negative.   Endocrine: Negative.   Musculoskeletal: Negative.   Neurological: Negative.   Hematological: Negative.   Psychiatric/Behavioral: Negative.   All other systems reviewed and are negative.    Objective: Vital Signs: There were no vitals taken for this visit.  Physical Exam  Constitutional: She is oriented to person, place, and time. She appears well-developed and well-nourished.  HENT:  Head: Normocephalic and atraumatic.  Eyes: EOM are normal.  Neck: Neck supple.  Pulmonary/Chest: Effort normal.  Abdominal: Soft.  Neurological:  She is alert and oriented to person, place, and time.  Skin: Skin is warm. Capillary refill takes less than 2 seconds.  Psychiatric: She has a normal mood and affect. Her behavior is normal. Judgment and thought content normal.  Nursing note and vitals reviewed.   Ortho Exam Right foot exam shows mild swelling and moderate bruising. There is no skin wounds. The foot is neurovascular intact. Specialty Comments:  No specialty comments available.  Imaging: No results found.   PMFS History: Patient Active Problem List   Diagnosis Date Noted  . Closed nondisplaced fracture of fifth right metatarsal bone 01/14/2017   Past Medical History:  Diagnosis Date  . Seasonal allergies   . Thyroid nodule    " multiple"  . Urinary tract infection    hx of    Family History  Problem Relation Age of Onset  . Anesthesia problems Neg Hx     Past Surgical History:  Procedure Laterality Date  . ANTERIOR CERVICAL DECOMP/DISCECTOMY FUSION  01/09/2012   Procedure: ANTERIOR CERVICAL DECOMPRESSION/DISCECTOMY FUSION 1 LEVEL;  Surgeon: Tia Alertavid S Jones, MD;  Location: [facility];  Service: Neurosurgery;  Laterality: N/A;  Cervical six-seven anterior cervical decompression fusion with interbody prosthesis and plating  . EYE SURGERY     "correction of lazy eye bilaterally"  . THYROIDECTOMY  07/22/2012  . THYROIDECTOMY  07/22/2012   Procedure: THYROIDECTOMY;  Surgeon: Serena ColonelJefry Rosen, MD;  Location: [facility] OR;  Service: ENT;  Laterality: Bilateral;  . WISDOM TOOTH EXTRACTION     Social History   Occupational History  . Not on file.   Social History Main Topics  . Smoking status: Former Smoker    Quit date: 06/15/2010  . Smokeless tobacco: Never Used  . Alcohol use Yes     Comment: "occassional"  . Drug use: No  . Sexual activity: Yes

## 2017-02-11 ENCOUNTER — Ambulatory Visit (INDEPENDENT_AMBULATORY_CARE_PROVIDER_SITE_OTHER): Payer: Self-pay | Admitting: Orthopaedic Surgery

## 2017-02-11 ENCOUNTER — Ambulatory Visit (INDEPENDENT_AMBULATORY_CARE_PROVIDER_SITE_OTHER): Payer: Managed Care, Other (non HMO)

## 2017-02-11 ENCOUNTER — Encounter (INDEPENDENT_AMBULATORY_CARE_PROVIDER_SITE_OTHER): Payer: Self-pay | Admitting: Orthopaedic Surgery

## 2017-02-11 DIAGNOSIS — S92354D Nondisplaced fracture of fifth metatarsal bone, right foot, subsequent encounter for fracture with routine healing: Secondary | ICD-10-CM

## 2017-02-11 NOTE — Progress Notes (Signed)
Patient is 4 weeks status post right fifth base metatarsal fracture. She is feeling better. She still has some discomfort in the morning especially. On exam she has a little bit of tenderness to palpation at the base of fifth metatarsal but this is significantly improved. X-ray show bridging callus formation and improvement of fracture. I'll see her back in 4 weeks with repeat 3 view x-rays of the right foot. She may begin nonimpact activities.

## 2017-03-17 ENCOUNTER — Ambulatory Visit (INDEPENDENT_AMBULATORY_CARE_PROVIDER_SITE_OTHER): Payer: Self-pay | Admitting: Orthopaedic Surgery

## 2017-03-17 ENCOUNTER — Ambulatory Visit (INDEPENDENT_AMBULATORY_CARE_PROVIDER_SITE_OTHER): Payer: Managed Care, Other (non HMO)

## 2017-03-17 DIAGNOSIS — S92354D Nondisplaced fracture of fifth metatarsal bone, right foot, subsequent encounter for fracture with routine healing: Secondary | ICD-10-CM

## 2017-03-17 NOTE — Progress Notes (Signed)
Betty Kelly is 5 weeks status post nondisplaced fifth metatarsal fracture. She is overall doing well. Just some occasional discomfort. She is not taking any medicines. Foot exam is relatively benign. X-rays are stable with evidence of healing. At this point she may increase activity as tolerated. No running for another 6-7 weeks. Follow-up in 6 weeks with repeat 3 view x-rays of right foot.

## 2017-04-29 ENCOUNTER — Ambulatory Visit (INDEPENDENT_AMBULATORY_CARE_PROVIDER_SITE_OTHER): Payer: Managed Care, Other (non HMO) | Admitting: Orthopaedic Surgery

## 2019-07-01 ENCOUNTER — Other Ambulatory Visit: Payer: Self-pay | Admitting: Family Medicine

## 2019-07-01 ENCOUNTER — Other Ambulatory Visit (HOSPITAL_COMMUNITY)
Admission: RE | Admit: 2019-07-01 | Discharge: 2019-07-01 | Disposition: A | Discharge status: Home / Self Care | Payer: Managed Care, Other (non HMO) | Source: Ambulatory Visit | Attending: Family Medicine | Admitting: Family Medicine

## 2019-07-01 DIAGNOSIS — Z124 Encounter for screening for malignant neoplasm of cervix: Secondary | ICD-10-CM | POA: Insufficient documentation

## 2019-07-06 LAB — CYTOLOGY - PAP
Comment: NEGATIVE
Diagnosis: NEGATIVE
High risk HPV: NEGATIVE

## 2024-10-03 NOTE — Progress Notes (Unsigned)
 " Cardiology Office Note:  .   Date:  10/06/2024 ID:  Betty Kelly, DOB [birth year], MRN [id number] PCP: Claudene Pellet, MD [facility] Providers Cardiologist:  None   Patient Profile: .      PMH Family history heart disease Hypertension Hyperlipidemia GERD Hypothyroidism Tobacco abuse       History of Present Illness: .    Discussed the use of AI scribe software for clinical note transcription with the patient, who gave verbal consent to proceed.  History of Present Illness Betty Kelly is a very pleasant 64 year old female who presents for evaluation of chest pain. She was referred by her primary care physician. She has chest pain daily that is not exacerbated by activity. It does not wake her up from sleep. She describes a hurtful chest sensation that feels like heartburn. She tries to burp to relieve it pain and has significant gas at night. Symptoms have improved with Align during the day and Pepcid Complete. She is concerned about her heart because her father had a heart attack at age 40 after initially thinking he had heartburn. Her mother developed heart damage in her late fifties or early sixties and required a pacemaker, and also had diabetes with severe complications. She has taken rosuvastatin for cholesterol for several years. LDL of 113 mg/dL on labs collected 04/12/7972. She takes lisinopril for blood pressure, which is generally well controlled. History of smoking, but she has stopped recently. Her diet is generally low in fat, but she admits she does not like most vegetables, fruits, or seafood. She has gained weight over the past three months, which she relates to menopause. Enjoys walking for exercise, but has been limited by foot pain. Was previously walking 3 miles 3-4 times per week on the treadmill. She tries to limit sugar. She denies leg swelling, orthopnea, exertional shortness of breath, exertional lightheadedness, or palpitations. She has occasional  shortness of breath that she associates with indigestion or heartburn.   Family history: Her family history includes Breast cancer in her maternal grandmother; Colon polyps in her mother; Dementia in her father; Diabetes in her mother; Healthy in her sister; Heart attack in her father; Heart disease in her mother and paternal grandfather; Hypercholesterolemia in her father and mother; Hypertension in her father and mother; Kidney disease in her mother; Kidney failure in her mother; Stroke in her father; Thyroid  cancer in her sister; Thyroid  disease in her mother and sister.   ASCVD Risk Score:  ASCVD (Atherosclerotic Cardiovascular Disease) Risk Algorithm including Known ASCVD from AHA/ACC from [email or web address] on 10/06/2024 ** All calculations should be rechecked by clinician prior to use **  RESULT SUMMARY: 9.2 % Risk of cardiovascular event (coronary or stroke death or non-fatal MI or stroke) in next 10 years.  Moderate- to high-intensity statin recommended because 10-year risk >7.5%   ACC/AHA recommends initiating statins at this risk level, but the USPSTF recommends a trial of lifestyle intervention first - use clinician judgment and shared decision making in deciding whether or not to start a statin  To view statin dosages by intensity, see Evidence section.  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[place], [place], [employer] to exercise, has treadmill at home but having right foot pain felt a pop  When exercising 3 miles 3-4 days per week  No results found for: LIPOA    ROS: + Chest pain       Studies Reviewed: SABRA   EKG Interpretation Date/Time:  Wednesday October 06 2024 07:57:35 EST Ventricular Rate:  62 PR Interval:  142 QRS Duration:  82 QT Interval:  392 QTC Calculation: 397 R Axis:   72  Text Interpretation: Normal sinus rhythm Normal ECG When compared with ECG of 08-Jan-2012 13:22, No significant change was found Confirmed by Percy Browning [phone]) on 10/06/2024 8:07:16 AM      Risk Assessment/Calculations:             Physical Exam:   VS: BP 128/66 (BP Location: Left Arm, Patient Position: Sitting, Cuff Size: Large)   Pulse 62   Ht 5' 6 (1.676 m)   Wt 188 lb (85.3 kg)   SpO2 99%   BMI 30.34 kg/m   Wt Readings from Last 3 Encounters:  10/06/24 188 lb (85.3 kg)  07/22/12 170 lb (77.1 kg)  07/13/12 169 lb 9.6 oz (76.9 kg)     GEN: Well nourished, well developed in no acute distress NECK: No JVD; No carotid bruits CARDIAC: RRR, no murmurs, rubs, gallops RESPIRATORY:  Clear to auscultation without rales, wheezing or rhonchi  ABDOMEN: Soft, non-tender, non-distended EXTREMITIES:  No edema; No deformity     ASSESSMENT AND PLAN: .    Assessment & Plan Chest pain   Cardiac risk Intermittent chest pain that occurs with activity and with rest. Mild relief in symptoms with Pepcid and probiotic.  No exercise-induced symptoms and no additional symptoms of dyspnea, n/v, or diaphoresis. EKG today is unremarkable. Significant family history of early CAD in her father.  Her mother had what sounds like structural heart disease. LDL cholesterol is not at goal. History of hypertension with BP that is well controlled. History of smoking but no history of diabetes.  We will get A1c to screen for diabetes. -Coronary CTA to evaluate for ischemia and mapping of coronary anatomy -  Echo to evaluate for structural heart disease -Lopressor  50 mg 2 hours prior to CT - Additional lipid testing and A1c as noted below - Continue lisinopril, rosuvastatin - Be as physically active as possible every day and aim for at least 150 minutes of moderate intensity exercise each week - Heart healthy diet avoiding processed foods, saturated fat, sugar, and other simple carbohydrates encouraged  Hyperlipidemia  LDL goal < 70 Lipid panel completed 09/13/24 with total cholesterol 196, HDL 71, triglycerides 65, and LDL-C 113.  She has been on rosuvastatin for several years.  Advised goal LDL will be 70 or lower in atherosclerosis is identified on coronary CT. will check additional labs for further risk stratification and recommend additional therapy if lipids not at goal. -Check apolipoprotein B, lipoprotein a, and A1c today for further risk stratification -Recommend additional lipid-lowering therapy if these levels are elevated and LDL remains above goal - Continue rosuvastatin 10 mg daily for now  Smoking Reports she has quit recently. - Complete cessation advised  Hypertension  BP is well controlled in clinic. Reports recently elevated BP at an office visit, but generally well controlled.  Renal function stable on labs completed 09/13/2024.  - Continue lisinopril - Continue to monitor home BP for goal < 130/80         Dispo: 2-3 months with me  Signed, Rosaline Bane, NP-C "

## 2024-10-05 ENCOUNTER — Encounter (HOSPITAL_BASED_OUTPATIENT_CLINIC_OR_DEPARTMENT_OTHER): Payer: Self-pay

## 2024-10-06 ENCOUNTER — Ambulatory Visit (HOSPITAL_BASED_OUTPATIENT_CLINIC_OR_DEPARTMENT_OTHER): Admitting: Nurse Practitioner

## 2024-10-06 ENCOUNTER — Encounter (HOSPITAL_BASED_OUTPATIENT_CLINIC_OR_DEPARTMENT_OTHER): Payer: Self-pay | Admitting: Nurse Practitioner

## 2024-10-06 VITALS — BP 128/66 | HR 62 | Ht 66.0 in | Wt 188.0 lb

## 2024-10-06 DIAGNOSIS — Z8249 Family history of ischemic heart disease and other diseases of the circulatory system: Secondary | ICD-10-CM

## 2024-10-06 DIAGNOSIS — F17201 Nicotine dependence, unspecified, in remission: Secondary | ICD-10-CM | POA: Diagnosis not present

## 2024-10-06 DIAGNOSIS — I10 Essential (primary) hypertension: Secondary | ICD-10-CM | POA: Diagnosis not present

## 2024-10-06 DIAGNOSIS — E785 Hyperlipidemia, unspecified: Secondary | ICD-10-CM | POA: Diagnosis not present

## 2024-10-06 DIAGNOSIS — Z131 Encounter for screening for diabetes mellitus: Secondary | ICD-10-CM

## 2024-10-06 DIAGNOSIS — R079 Chest pain, unspecified: Secondary | ICD-10-CM

## 2024-10-06 DIAGNOSIS — Z7189 Other specified counseling: Secondary | ICD-10-CM | POA: Diagnosis not present

## 2024-10-06 MED ORDER — METOPROLOL TARTRATE 50 MG PO TABS: 50.0000 mg | ORAL_TABLET | Freq: Once | ORAL | 0 refills | Status: AC

## 2024-10-06 NOTE — Patient Instructions (Signed)
 Medication Instructions:   Your physician recommends that you continue on your current medications as directed. Please refer to the Current Medication list given to you today.   *If you need a refill on your cardiac medications before your next appointment, please call your pharmacy*  Lab Work:  TODAY!!!! APOLIPOPROTEIN B/A1C/LPA  If you have labs (blood work) drawn today and your tests are completely normal, you will receive your results only by: MyChart Message (if you have MyChart) OR A paper copy in the mail If you have any lab test that is abnormal or we need to change your treatment, we will call you to review the results.  Testing/Procedures:  Your physician has requested that you have an echocardiogram. Echocardiography is a painless test that uses sound waves to create images of your heart. It provides your doctor with information about the size and shape of your heart and how well your hearts chambers and valves are working. This procedure takes approximately one hour. There are no restrictions for this procedure. Please do NOT wear cologne, perfume or lotions (deodorant is allowed). Please arrive 15 minutes prior to your appointment time.    Your cardiac CT will be scheduled at one of the below locations:   [facility] [address]  [place], [place] [address]  If scheduled at the [facility] at [address], please enter the parking lot using the [address] entrance and use the FREE valet service at the patient drop-off area. Enter the building and check-in with registration on the main floor.  Please follow these instructions carefully (unless otherwise directed):  An IV will be required for this test and Nitroglycerin will be given.   On the Night Before the Test: Be sure to Drink plenty of water. Do not consume any caffeinated/decaffeinated beverages or chocolate 12 hours prior to your test. Do not take any  antihistamines 12 hours prior to your test.   On the Day of the Test: Drink plenty of water until 1 hour prior to the test. Do not eat any food 1 hour prior to test. You may take your regular medications prior to the test.  Take metoprolol  (Lopressor )ONE (1) TABLET BY MOUTH ( 50 MG)  two hours prior to test. FEMALES- please wear underwire-free bra if available, avoid dresses & tight clothing      After the Test: Drink plenty of water. After receiving IV contrast, you may experience a mild flushed feeling. This is normal. On occasion, you may experience a mild rash up to 24 hours after the test. This is not dangerous. If this occurs, you can take Benadryl 25 mg, Zyrtec, Claritin , or Allegra and increase your fluid intake. (Patients taking Tikosyn should avoid Benadryl, and may take Zyrtec, Claritin , or Allegra) If you experience trouble breathing, this can be serious. If it is severe call 911 IMMEDIATELY. If it is mild, please call our office.  We will call to schedule your test 2-4 weeks out understanding that some insurance companies will need an authorization prior to the service being performed.   For more information and frequently asked questions, please visit our website : [email or web address]  For non-scheduling related questions, please contact the cardiac imaging nurse navigator should you have any questions/concerns: Cardiac Imaging Nurse Navigators Direct Office Dial: [phone]   For scheduling needs, including cancellations and rescheduling, please call Brittany, [phone].  For billing questions, please call [phone].      Follow-Up: At [facility], you  and your health needs are our priority.  As part of our continuing mission to provide you with exceptional heart care, our providers are all part of one team.  This team includes your primary Cardiologist (physician) and Advanced Practice Providers or APPs (Physician Assistants and Nurse  Practitioners) who all work together to provide you with the care you need, when you need it.  Your next appointment:   3 month(s)  Provider:   Rosaline Bane, NP    We recommend signing up for the patient portal called MyChart.  Sign up information is provided on this After Visit Summary.  MyChart is used to connect with patients for Virtual Visits (Telemedicine).  Patients are able to view lab/test results, encounter notes, upcoming appointments, etc.  Non-urgent messages can be sent to your provider as well.   To learn more about what you can do with MyChart, go to [email or web address].   Other Instructions     Adopting a Healthy Lifestyle.   Weight: Know what a healthy weight is for you (roughly BMI <25) and aim to maintain this. You can calculate your body mass index on your smart phone. Unfortunately, this is not the most accurate measure of healthy weight, but it is the simplest measurement to use. A more accurate measurement involves body scanning which measures lean muscle, fat tissue and bony density. We do not have this equipment at [facility].    Diet: Aim for 7+ servings of fruits and vegetables daily Limit animal fats in diet for cholesterol and heart health - choose grass fed whenever available Avoid highly processed foods (fast food burgers, tacos, fried chicken, pizza, hot dogs, french fries)  Saturated fat comes in the form of butter, lard, coconut oil, margarine, partially hydrogenated oils, dairy products, and fat in meat. These increase your risk of cardiovascular disease.  Use healthy plant oils, such as olive, canola, soy, corn, sunflower and peanut.  Whole foods such as fruits, vegetables and whole grains have fiber  Men need > 38 grams of fiber per day Women need > 25 grams of fiber per day  Load up on vegetables and fruits - one-half of your plate: Aim for color and variety, and remember that potatoes dont count. Go for whole grains - one-quarter of your  plate: Whole wheat, barley, wheat berries, quinoa, oats, brown rice, and foods made with them. If you want pasta, go with whole wheat pasta. Protein power - one-quarter of your plate: Fish, chicken, beans, and nuts are all healthy, versatile protein sources. Limit red meat. You need carbohydrates for energy! The type of carbohydrate is more important than the amount. Choose carbohydrates such as vegetables, fruits, whole grains, beans, and nuts in the place of white rice, white pasta, potatoes (baked or fried), macaroni and cheese, cakes, cookies, and donuts.  If youre thirsty, drink water. Coffee and tea are good in moderation, but skip sugary drinks and limit milk and dairy products to one or two daily servings. Keep sugar intake at 6 teaspoons or 24 grams or LESS       Exercise: Aim for 150 min of moderate intensity exercise weekly for heart health, and weights twice weekly for bone health Stay active - any steps are better than no steps! Aim for 7-9 hours of sleep daily   Sleep: This provides your body with the reset and relaxation that it needs!  Aim to get 7-8 hours of sleep each night. Limit caffeine, screen time, and other distractions prior to bedtime.  Keep  your bedroom cool and dark and do not wear heavy clothing to bed or use heavy bed covers - layer if needed.

## 2024-10-08 ENCOUNTER — Ambulatory Visit (HOSPITAL_BASED_OUTPATIENT_CLINIC_OR_DEPARTMENT_OTHER): Payer: Self-pay | Admitting: Nurse Practitioner

## 2024-10-08 LAB — HEMOGLOBIN A1C
Est. average glucose Bld gHb Est-mCnc: 111 mg/dL
Hgb A1c MFr Bld: 5.5 % (ref 4.8–5.6)

## 2024-10-08 LAB — APOLIPOPROTEIN B: Apolipoprotein B: 90 mg/dL — ABNORMAL HIGH

## 2024-10-08 LAB — LIPOPROTEIN A (LPA): Lipoprotein (a): 182.8 nmol/L — ABNORMAL HIGH

## 2024-10-18 ENCOUNTER — Other Ambulatory Visit (HOSPITAL_BASED_OUTPATIENT_CLINIC_OR_DEPARTMENT_OTHER)

## 2024-10-19 ENCOUNTER — Ambulatory Visit (HOSPITAL_COMMUNITY)

## 2024-12-27 ENCOUNTER — Ambulatory Visit (HOSPITAL_BASED_OUTPATIENT_CLINIC_OR_DEPARTMENT_OTHER): Admitting: Nurse Practitioner
